# Patient Record
Sex: Male | Born: 1980 | Race: Black or African American | Hispanic: No | Smoking: Current every day smoker
Health system: Southern US, Community
[De-identification: ages and names within clinical notes are randomized; demographics above are authoritative.]

## PROBLEM LIST (undated history)

## (undated) DIAGNOSIS — F419 Anxiety disorder, unspecified: Secondary | ICD-10-CM

## (undated) DIAGNOSIS — F32A Depression, unspecified: Secondary | ICD-10-CM

## (undated) DIAGNOSIS — Z8619 Personal history of other infectious and parasitic diseases: Secondary | ICD-10-CM

## (undated) DIAGNOSIS — G4733 Obstructive sleep apnea (adult) (pediatric): Secondary | ICD-10-CM

## (undated) DIAGNOSIS — K219 Gastro-esophageal reflux disease without esophagitis: Secondary | ICD-10-CM

## (undated) DIAGNOSIS — F329 Major depressive disorder, single episode, unspecified: Secondary | ICD-10-CM

## (undated) HISTORY — DX: Personal history of other infectious and parasitic diseases: Z86.19

## (undated) HISTORY — DX: Anxiety disorder, unspecified: F41.9

## (undated) HISTORY — DX: Gastro-esophageal reflux disease without esophagitis: K21.9

## (undated) HISTORY — DX: Major depressive disorder, single episode, unspecified: F32.9

## (undated) HISTORY — DX: Obstructive sleep apnea (adult) (pediatric): G47.33

## (undated) HISTORY — DX: Depression, unspecified: F32.A

## (undated) HISTORY — PX: VEIN SURGERY: SHX48

---
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
## 1999-10-20 ENCOUNTER — Emergency Department (HOSPITAL_COMMUNITY): Admission: EM | Admit: 1999-10-20 | Discharge: 1999-10-20 | Payer: Self-pay

## 1999-10-23 ENCOUNTER — Ambulatory Visit (HOSPITAL_COMMUNITY): Admission: RE | Admit: 1999-10-23 | Discharge: 1999-10-23 | Payer: Self-pay

## 1999-10-25 ENCOUNTER — Encounter: Payer: Self-pay | Admitting: Emergency Medicine

## 1999-10-25 ENCOUNTER — Emergency Department (HOSPITAL_COMMUNITY): Admission: EM | Admit: 1999-10-25 | Discharge: 1999-10-25 | Payer: Self-pay | Admitting: Emergency Medicine

## 2002-01-08 ENCOUNTER — Emergency Department (HOSPITAL_COMMUNITY): Admission: EM | Admit: 2002-01-08 | Discharge: 2002-01-08 | Payer: Self-pay | Admitting: Emergency Medicine

## 2004-11-03 ENCOUNTER — Emergency Department (HOSPITAL_COMMUNITY): Admission: EM | Admit: 2004-11-03 | Discharge: 2004-11-03 | Payer: Self-pay | Admitting: Emergency Medicine

## 2006-07-24 ENCOUNTER — Emergency Department (HOSPITAL_COMMUNITY): Admission: EM | Admit: 2006-07-24 | Discharge: 2006-07-24 | Payer: Self-pay | Admitting: Family Medicine

## 2009-03-04 ENCOUNTER — Emergency Department (HOSPITAL_COMMUNITY): Admission: EM | Admit: 2009-03-04 | Discharge: 2009-03-04 | Payer: Self-pay | Admitting: Emergency Medicine

## 2010-03-05 ENCOUNTER — Ambulatory Visit: Payer: Self-pay | Admitting: Family Medicine

## 2010-03-05 DIAGNOSIS — Z9189 Other specified personal risk factors, not elsewhere classified: Secondary | ICD-10-CM | POA: Insufficient documentation

## 2010-03-05 DIAGNOSIS — K2990 Gastroduodenitis, unspecified, without bleeding: Secondary | ICD-10-CM

## 2010-03-05 DIAGNOSIS — R197 Diarrhea, unspecified: Secondary | ICD-10-CM | POA: Insufficient documentation

## 2010-03-05 DIAGNOSIS — K2970 Gastritis, unspecified, without bleeding: Secondary | ICD-10-CM | POA: Insufficient documentation

## 2010-03-05 DIAGNOSIS — K219 Gastro-esophageal reflux disease without esophagitis: Secondary | ICD-10-CM | POA: Insufficient documentation

## 2010-03-05 DIAGNOSIS — F17200 Nicotine dependence, unspecified, uncomplicated: Secondary | ICD-10-CM | POA: Insufficient documentation

## 2010-03-27 ENCOUNTER — Ambulatory Visit: Payer: Self-pay | Admitting: Family Medicine

## 2010-03-28 LAB — CONVERTED CEMR LAB
ALT: 27 units/L (ref 0–53)
AST: 21 units/L (ref 0–37)
Albumin: 3.9 g/dL (ref 3.5–5.2)
Alkaline Phosphatase: 82 units/L (ref 39–117)
BUN: 17 mg/dL (ref 6–23)
Basophils Absolute: 0 10*3/uL (ref 0.0–0.1)
Basophils Relative: 0.3 % (ref 0.0–3.0)
Bilirubin, Direct: 0.2 mg/dL (ref 0.0–0.3)
CO2: 24 meq/L (ref 19–32)
Calcium: 9 mg/dL (ref 8.4–10.5)
Chloride: 106 meq/L (ref 96–112)
Cholesterol: 165 mg/dL (ref 0–200)
Creatinine, Ser: 0.9 mg/dL (ref 0.4–1.5)
Eosinophils Absolute: 0.2 10*3/uL (ref 0.0–0.7)
Eosinophils Relative: 2.6 % (ref 0.0–5.0)
GFR calc non Af Amer: 123.03 mL/min (ref >60)
Glucose, Bld: 88 mg/dL (ref 70–99)
HCT: 50.1 % (ref 39.0–52.0)
HDL: 44.4 mg/dL (ref >39.00)
Hemoglobin: 17.1 g/dL — ABNORMAL HIGH (ref 13.0–17.0)
LDL Cholesterol: 102 mg/dL — ABNORMAL HIGH (ref 0–99)
Lymphocytes Relative: 26.9 % (ref 12.0–46.0)
Lymphs Abs: 2.2 10*3/uL (ref 0.7–4.0)
MCHC: 34.2 g/dL (ref 30.0–36.0)
MCV: 86.8 fL (ref 78.0–100.0)
Monocytes Absolute: 0.6 10*3/uL (ref 0.1–1.0)
Monocytes Relative: 7.9 % (ref 3.0–12.0)
Neutro Abs: 5 10*3/uL (ref 1.4–7.7)
Neutrophils Relative %: 62.3 % (ref 43.0–77.0)
Platelets: 271 10*3/uL (ref 150.0–400.0)
Potassium: 4.4 meq/L (ref 3.5–5.1)
RBC: 5.77 M/uL (ref 4.22–5.81)
RDW: 14.2 % (ref 11.5–14.6)
Sodium: 140 meq/L (ref 135–145)
TSH: 1.08 microintl units/mL (ref 0.35–5.50)
Total Bilirubin: 0.7 mg/dL (ref 0.3–1.2)
Total CHOL/HDL Ratio: 4
Total Protein: 6.7 g/dL (ref 6.0–8.3)
Triglycerides: 93 mg/dL (ref 0.0–149.0)
VLDL: 18.6 mg/dL (ref 0.0–40.0)
WBC: 8.1 10*3/uL (ref 4.5–10.5)

## 2010-04-19 ENCOUNTER — Ambulatory Visit: Payer: Self-pay | Admitting: Family Medicine

## 2010-04-19 DIAGNOSIS — I8390 Asymptomatic varicose veins of unspecified lower extremity: Secondary | ICD-10-CM

## 2010-04-19 DIAGNOSIS — I8391 Asymptomatic varicose veins of right lower extremity: Secondary | ICD-10-CM | POA: Insufficient documentation

## 2010-07-23 NOTE — Assessment & Plan Note (Signed)
Summary: CPX/CLE   Vital Signs:  Patient profile:   30 year old male Height:      72 inches Weight:      251.0 pounds BMI:     34.16 Temp:     98.8 degrees F oral Pulse rate:   72 / minute Pulse rhythm:   regular BP sitting:   110 / 70  (left arm) Cuff size:   regular  Vitals Entered By: Benny Lennert CMA Duncan Dull) (April 19, 2010 8:41 AM)  History of Present Illness: Chief complaint cpx  GERD, gastriris...improved with diet changes.  Prilosec helps as well.  Diarrhea..better with increasing fiber in diet. Better energy.  Has area on right inner lower leg.. varicose vein.Marland Kitchennoted years ago. It is getting bigger. When on treadmill right leg ach than left. No pain with sitting or standing.  Does have pins and needles in B toes occ.    Problems Prior to Update: 1)  Screening For Lipoid Disorders  (ICD-V77.91) 2)  Tobacco Abuse  (ICD-305.1) 3)  Gastritis  (ICD-535.50) 4)  Diarrhea  (ICD-787.91) 5)  Gerd  (ICD-530.81) 6)  Chickenpox, Hx of  (ICD-V15.9)  Current Medications (verified): 1)  Prilosec 20 Mg Cpdr (Omeprazole) .... 2 Tab By Mouth Daily  Allergies (verified): No Known Drug Allergies  Past History:  Past medical, surgical, family and social histories (including risk factors) reviewed, and no changes noted (except as noted below).  Past Medical History: Reviewed history from 03/05/2010 and no changes required. Current Problems:  CHICKENPOX, HX OF (ICD-V15.9)    Past Surgical History: Reviewed history from 03/05/2010 and no changes required. none  Family History: Reviewed history from 03/05/2010 and no changes required. father:healthy mother:healthy siblings: healthy, ?depression/schizophrenia in brother  no colon cancer or stmache cancer in family  Social History: Reviewed history from 03/05/2010 and no changes required. Occupation:Works for [employer] Single 1 daughter...healthy 8 years olf Recently got out of bad relationship with mother  of child. Current Smoker: 1 ppd Alcohol use-yes, 3 drinks every other weekend. Drug use-yes, marijuana occ Regular exercise-yes, 3 times a week.  Diet: skips reakfast, occ lunch, large dinner, a lot of fast food.  Review of Systems General:  Denies fatigue and fever. CV:  Denies chest pain or discomfort. Resp:  Denies shortness of breath. GI:  Denies abdominal pain, bloody stools, constipation, and diarrhea. GU:  Denies dysuria.  Physical Exam  General:  obese appearing male with central obesity in NAD Eyes:  No corneal or conjunctival inflammation noted. EOMI. Perrla. Funduscopic exam benign, without hemorrhages, exudates or papilledema. Vision grossly normal. Ears:  External ear exam shows no significant lesions or deformities.  Otoscopic examination reveals clear canals, tympanic membranes are intact bilaterally without bulging, retraction, inflammation or discharge. Hearing is grossly normal bilaterally. Nose:  External nasal examination shows no deformity or inflammation. Nasal mucosa are pink and moist without lesions or exudates. Mouth:  Oral mucosa and oropharynx without lesions or exudates.  Teeth in good repair. Neck:  no carotid bruit or thyromegaly no cervical or supraclavicular lymphadenopathy  Lungs:  Normal respiratory effort, chest expands symmetrically. Lungs are clear to auscultation, no crackles or wheezes. Heart:  Normal rate and regular rhythm. S1 and S2 normal without gallop, murmur, click, rub or other extra sounds. Abdomen:  Bowel sounds positive,abdomen soft and non-tender without masses, organomegaly or hernias noted. Genitalia:  Testes bilaterally descended without nodularity, tenderness or masses. No scrotal masses or lesions. No penis lesions or urethral discharge. Msk:  No  deformity or scoliosis noted of thoracic or lumbar spine.   Pulses:  R and L posterior tibial pulses are full and equal bilaterally  Extremities:  no edema Neurologic:  No cranial nerve  deficits noted. Station and gait are normal. Plantar reflexes are down-going bilaterally. DTRs are symmetrical throughout. Sensory, motor and coordinative functions appear intact. Skin:  Intact without suspicious lesions or rashes Psych:  Cognition and judgment appear intact. Alert and cooperative with normal attention span and concentration. No apparent delusions, illusions, hallucinations   Impression & Recommendations:  Problem # 1:  Preventive Health Care (ICD-V70.0) The patient's preventative maintenance and recommended screening tests for an annual wellness exam were reviewed in full today. Brought up to date unless services declined.  Counselled on the importance of diet, exercise, and its role in overall health and mortality. The patient's FH and SH was reviewed, including their home life, tobacco status, and drug and alcohol status.     Problem # 2:  VARICOSE VEINS, LOWER EXTREMITIES (ICD-454.9)  Orders: Vascular Clinic (Vascular)  Complete Medication List: 1)  Prilosec 20 Mg Cpdr (Omeprazole) .... 2 tab by mouth daily  Other Orders: Tdap => 11yrs IM (95284) Admin 1st Vaccine (13244)  Patient Instructions: 1)  Work on exercsie, weight loss, elevate feet above heart, look into compression hose 15-30 mmHG...online ocntinue excellent changes in diet. 2)   Consider smoking cessation with Chantix given at last OV. 3)  Please schedule a follow-up appointment in 1 year.  4)  Referral Appointment Information 5)  Day/Date: 6)  Time: 7)  Place/MD: 8)  Address: 9)  Phone/Fax: 10)  Patient given appointment information. Information/Orders faxed/mailed.    Orders Added: 1)  Tdap => 97yrs IM [90715] 2)  Admin 1st Vaccine [90471] 3)  Vascular Clinic [Vascular] 4)  Est. Patient 18-39 years [99395]   Immunizations Administered:  Tetanus Vaccine:    Vaccine Type: Tdap    Site: right deltoid    Mfr: GlaxoSmithKline    Dose: 0.5 ml    Route: IM    Given by: Benny Lennert  CMA (AAMA)    Exp. Date: 04/11/2012    Lot #: [id number]    VIS given: 05/10/08 version given April 19, 2010.   Immunizations Administered:  Tetanus Vaccine:    Vaccine Type: Tdap    Site: right deltoid    Mfr: GlaxoSmithKline    Dose: 0.5 ml    Route: IM    Given by: Benny Lennert CMA (AAMA)    Exp. Date: 04/11/2012    Lot #: [id number]    VIS given: 05/10/08 version given April 19, 2010.  Current Allergies (reviewed today): No known allergies   Flu Vaccine Next Due:  Refused TD Result Date:  04/19/2010 TD Result:  given TD Next Due:  10 yr

## 2010-07-23 NOTE — Assessment & Plan Note (Signed)
Summary: NEW PT TO ESTABH/DLO   Vital Signs:  Patient profile:   30 year old male Height:      72 inches Weight:      251.0 pounds BMI:     34.16 Temp:     98.7 degrees F oral Pulse rate:   72 / minute Pulse rhythm:   regular BP sitting:   120 / 70  (left arm) Cuff size:   regular  Vitals Entered By: Benny Lennert CMA Duncan Dull) (March 05, 2010 10:02 AM)  History of Present Illness: Chief complaint new patient to be established    Concerned he may have stomach ulcer. UNder a lot of stress.  Any type of food he eats causes diarrhea or GERD. Occ abdominal cramping. Hot feeling in throat.... occurs daily.  Using a lot of Tums or ROlaids.  He reports he has 3-4 "stomach viruses" a year.Marland KitchenMarland KitchenN/V/diarrhea  Preventive Screening-Counseling & Management  Alcohol-Tobacco     Smoking Status: current  Caffeine-Diet-Exercise     Does Patient Exercise: yes      Drug Use:  yes.    Allergies (verified): No Known Drug Allergies  Past History:  Past Medical History: Current Problems:  CHICKENPOX, HX OF (ICD-V15.9)    Past Surgical History: none  Family History: father:healthy mother:healthy siblings: healthy, ?depression/schizophrenia in brother  no colon cancer or stmache cancer in family  Social History: Occupation:Works for [employer] Single 1 daughter...healthy 8 years olf Recently got out of bad relationship with mother of child. Current Smoker: 1 ppd Alcohol use-yes, 3 drinks every other weekend. Drug use-yes, marijuana occ Regular exercise-yes, 3 times a week.  Diet: skips reakfast, occ lunch, large dinner, a lot of fast food. Occupation:  employed Smoking Status:  current Drug Use:  yes Does Patient Exercise:  yes  Review of Systems CV:  Denies chest pain or discomfort. Resp:  Complains of shortness of breath; denies cough, sputum productive, and wheezing. GI:  Complains of constipation, diarrhea, gas, indigestion, and nausea; denies bloody  stools. GU:  Denies dysuria. Derm:  Denies rash. Psych:  Denies anxiety, depression, panic attacks, and suicidal thoughts/plans.  Physical Exam  General:  obese appearing male with central obesity in NAD Ears:  External ear exam shows no significant lesions or deformities.  Otoscopic examination reveals clear canals, tympanic membranes are intact bilaterally without bulging, retraction, inflammation or discharge. Hearing is grossly normal bilaterally. Nose:  External nasal examination shows no deformity or inflammation. Nasal mucosa are pink and moist without lesions or exudates. Mouth:  Oral mucosa and oropharynx without lesions or exudates.  Teeth in good repair. Neck:  no carotid bruit or thyromegaly , no cervical or supraclavicular lymphadenopathy  Lungs:  Normal respiratory effort, chest expands symmetrically. Lungs are clear to auscultation, no crackles or wheezes. Heart:  Normal rate and regular rhythm. S1 and S2 normal without gallop, murmur, click, rub or other extra sounds. Abdomen:  mild ttp in epigastrum, no rebound, no guareding, no hepatomegaly and no splenomegaly.   Msk:  No deformity or scoliosis noted of thoracic or lumbar spine.   Pulses:  R and L posterior tibial pulses are full and equal bilaterally  Extremities:  no edema  Skin:  Intact without suspicious lesions or rashes Psych:  Cognition and judgment appear intact. Alert and cooperative with normal attention span and concentration. No apparent delusions, illusions, hallucinations   Impression & Recommendations:  Problem # 1:  GERD (ICD-530.81)  His updated medication list for this problem includes:  Prilosec 20 Mg Cpdr (Omeprazole) .Marland Kitchen... 2 tab by mouth daily   Discussed lifestyle modifications, diet, antacids/medications, and preventive measures. Handout provided.  Start prilosec 40 mg daily x 4-6 weeks.  Problem # 2:  GASTRITIS (ICD-535.50) If not improving with treatment of GERD...consider H pYlori eval  or endoscopy.  His updated medication list for this problem includes:    Prilosec 20 Mg Cpdr (Omeprazole) .Marland Kitchen... 2 tab by mouth daily  Problem # 3:  DIARRHEA (ICD-787.91) Eval with labs. Likel IBS. Discussed diet and lifestyle changes in detail.   Problem # 4:  TOBACCO ABUSE (ICD-305.1)  His updated medication list for this problem includes:    Chantix Starting Month Pak 0.5 Mg X 11 & 1 Mg X 42 Tabs (Varenicline tartrate) .Marland Kitchen... As directed    Chantix Continuing Month Pak 1 Mg Tabs (Varenicline tartrate) .Marland Kitchen... 1 tab by mouth two times a day  Orders: Tobacco use cessation intermediate 3-10 minutes (04540)  Encouraged smoking cessation and discussed different methods for smoking cessation.   Complete Medication List: 1)  Chantix Starting Month Pak 0.5 Mg X 11 & 1 Mg X 42 Tabs (Varenicline tartrate) .... As directed 2)  Chantix Continuing Month Pak 1 Mg Tabs (Varenicline tartrate) .Marland Kitchen.. 1 tab by mouth two times a day 3)  Prilosec 20 Mg Cpdr (Omeprazole) .... 2 tab by mouth daily  Patient Instructions: 1)  Schedule CPX in next 1-2 months. 2)  Schedule fasting lipids, CMET, TSH, Cbc Dx v77.91, 787.91 3)  Stop soda ..instead drink water, crystal light 4)  Decrease caffeine, citris, tomato, peppermint. 5)  Largest meal earlier in day. 6)  Prilosec (omeprazole) 2 tabs (20 mg) by mouth daily ... take for 4- 6weeks then wean off.  Call if no improvement in reflux after 1-2 weeks. 7)  Stop smoking. Start Chantix. 8)  Decrease greasy food. 9)  Increase fiber in diet..fruits and veggies. Whole wheat bread. 10)  Consider trying [employer]. Prescriptions: CHANTIX CONTINUING MONTH PAK 1 MG TABS (VARENICLINE TARTRATE) 1 tab by mouth two times a day  #1 pack x 5   Entered and Authorized by:   Kerby Nora MD   Signed by:   Kerby Nora MD on 03/05/2010   Method used:   Electronically to        [facility] # [id number]* (retail)       [address] [place], [place]  [address]        Ph: [phone]       Fax: [phone]   RxID:   [id number] CHANTIX STARTING MONTH PAK 0.5 MG X 11 & 1 MG X 42 TABS (VARENICLINE TARTRATE) as directed  #1  pack x 0   Entered and Authorized by:   Kerby Nora MD   Signed by:   Kerby Nora MD on 03/05/2010   Method used:   Electronically to        [facility] # [phone]* (retail)       [address] [place], [place]  [address]       Ph: [phone]       Fax: [phone]   RxID:   [id number]   Prior Medications (reviewed today): None Current Allergies (reviewed today): No known allergies

## 2011-01-09 ENCOUNTER — Encounter: Payer: Self-pay | Admitting: Family Medicine

## 2011-01-14 ENCOUNTER — Ambulatory Visit (INDEPENDENT_AMBULATORY_CARE_PROVIDER_SITE_OTHER): Payer: Managed Care, Other (non HMO) | Admitting: Family Medicine

## 2011-01-14 ENCOUNTER — Encounter: Payer: Self-pay | Admitting: Family Medicine

## 2011-01-14 VITALS — BP 120/88 | HR 73 | Temp 98.4°F | Ht 72.0 in | Wt 269.5 lb

## 2011-01-14 DIAGNOSIS — F32A Depression, unspecified: Secondary | ICD-10-CM | POA: Insufficient documentation

## 2011-01-14 DIAGNOSIS — F419 Anxiety disorder, unspecified: Secondary | ICD-10-CM

## 2011-01-14 DIAGNOSIS — F410 Panic disorder [episodic paroxysmal anxiety] without agoraphobia: Secondary | ICD-10-CM

## 2011-01-14 DIAGNOSIS — F411 Generalized anxiety disorder: Secondary | ICD-10-CM

## 2011-01-14 MED ORDER — BUPROPION HCL ER (XL) 150 MG PO TB24: 150.0000 mg | ORAL_TABLET | Freq: Every day | ORAL | Status: DC

## 2011-01-14 NOTE — Patient Instructions (Signed)
Start bupropion daily in morning. Call if interested in counselor.  Work on daily exercise as able.

## 2011-01-14 NOTE — Assessment & Plan Note (Signed)
Start wellbutrin daily given SE profile. Reviewed med use and SE profile with pt in detail.  Recommended exercise and counseling. Pt will consider pshycology referral.  Follow up in 1 month.  Spent >30 min counseling pt , answering questions and pt care coordination.
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
## 2011-01-26 ENCOUNTER — Emergency Department (HOSPITAL_COMMUNITY)
Admission: EM | Admit: 2011-01-26 | Discharge: 2011-01-26 | Disposition: A | Discharge status: Home / Self Care | Payer: Managed Care, Other (non HMO) | Attending: Emergency Medicine | Admitting: Emergency Medicine

## 2011-01-26 DIAGNOSIS — K089 Disorder of teeth and supporting structures, unspecified: Secondary | ICD-10-CM | POA: Insufficient documentation

## 2011-01-26 DIAGNOSIS — K029 Dental caries, unspecified: Secondary | ICD-10-CM | POA: Insufficient documentation

## 2011-02-21 ENCOUNTER — Ambulatory Visit: Payer: Managed Care, Other (non HMO) | Admitting: Family Medicine

## 2011-02-27 ENCOUNTER — Ambulatory Visit (INDEPENDENT_AMBULATORY_CARE_PROVIDER_SITE_OTHER): Payer: Managed Care, Other (non HMO) | Admitting: Family Medicine

## 2011-02-27 ENCOUNTER — Encounter: Payer: Self-pay | Admitting: Family Medicine

## 2011-02-27 DIAGNOSIS — F329 Major depressive disorder, single episode, unspecified: Secondary | ICD-10-CM

## 2011-02-27 DIAGNOSIS — F419 Anxiety disorder, unspecified: Secondary | ICD-10-CM

## 2011-02-27 DIAGNOSIS — F410 Panic disorder [episodic paroxysmal anxiety] without agoraphobia: Secondary | ICD-10-CM

## 2011-02-27 DIAGNOSIS — F341 Dysthymic disorder: Secondary | ICD-10-CM

## 2011-02-27 DIAGNOSIS — F32A Depression, unspecified: Secondary | ICD-10-CM

## 2011-02-27 MED ORDER — SERTRALINE HCL 50 MG PO TABS: 50.0000 mg | ORAL_TABLET | Freq: Every day | ORAL | Status: DC

## 2011-02-27 NOTE — Patient Instructions (Signed)
Start sertraline 50 mg daily. Stop by front desk to speak with Shirlee Limerick about info packet for counsling referral.

## 2011-02-27 NOTE — Progress Notes (Signed)
  Subjective:    Patient ID: Alex Parsons, male    DOB: 06-22-1981, 30 y.o.   MRN: [id number]  HPI  30 year male presents for anxiety follow up... He reports he tried wellbutrin without any improvement. He reports his symptoms are the same as previously noted in last OV.  Continued issues with panic attacks, driving associated often. Also generalized anxiety through the day.       Review of Systems  Respiratory: Negative for shortness of breath.   Cardiovascular: Negative for chest pain.  Gastrointestinal: Negative for abdominal pain.  Psychiatric/Behavioral: Negative for suicidal ideas, hallucinations and self-injury.       Objective:   Physical Exam  Constitutional: He appears well-developed and well-nourished.  Cardiovascular: Normal rate, regular rhythm, normal heart sounds and intact distal pulses.   Pulmonary/Chest: Effort normal.  Skin: Skin is warm and dry.  Psychiatric: He has a normal mood and affect. His behavior is normal. Judgment and thought content normal.          Assessment & Plan:
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
## 2011-03-12 ENCOUNTER — Ambulatory Visit: Payer: Managed Care, Other (non HMO) | Admitting: Psychology

## 2011-04-07 ENCOUNTER — Telehealth: Payer: Self-pay | Admitting: Family Medicine

## 2011-04-07 DIAGNOSIS — Z13220 Encounter for screening for lipoid disorders: Secondary | ICD-10-CM

## 2011-04-07 NOTE — Telephone Encounter (Signed)
Error

## 2011-04-07 NOTE — Telephone Encounter (Signed)
Let me know if pt interested in STD testing.

## 2011-04-07 NOTE — Telephone Encounter (Signed)
Message copied by Excell Seltzer on Mon Apr 07, 2011  4:30 PM ------      Message from: Alvina Chou      Created: Mon Apr 07, 2011  3:31 PM      Regarding: Labs for Friday 10-19       Patient is scheduled for CPX labs, please order future labs, Thanks , Camelia Eng

## 2011-04-11 ENCOUNTER — Other Ambulatory Visit: Payer: Managed Care, Other (non HMO)

## 2011-04-18 ENCOUNTER — Ambulatory Visit (INDEPENDENT_AMBULATORY_CARE_PROVIDER_SITE_OTHER): Payer: Managed Care, Other (non HMO) | Admitting: Family Medicine

## 2011-04-18 ENCOUNTER — Encounter: Payer: Self-pay | Admitting: Family Medicine

## 2011-04-18 VITALS — BP 110/72 | HR 78 | Temp 98.1°F | Ht 72.0 in | Wt 263.0 lb

## 2011-04-18 DIAGNOSIS — F32A Depression, unspecified: Secondary | ICD-10-CM

## 2011-04-18 DIAGNOSIS — Z0000 Encounter for general adult medical examination without abnormal findings: Secondary | ICD-10-CM

## 2011-04-18 DIAGNOSIS — F17200 Nicotine dependence, unspecified, uncomplicated: Secondary | ICD-10-CM

## 2011-04-18 DIAGNOSIS — Z13220 Encounter for screening for lipoid disorders: Secondary | ICD-10-CM

## 2011-04-18 DIAGNOSIS — F410 Panic disorder [episodic paroxysmal anxiety] without agoraphobia: Secondary | ICD-10-CM

## 2011-04-18 DIAGNOSIS — F419 Anxiety disorder, unspecified: Secondary | ICD-10-CM

## 2011-04-18 DIAGNOSIS — F341 Dysthymic disorder: Secondary | ICD-10-CM

## 2011-04-18 MED ORDER — SERTRALINE HCL 50 MG PO TABS: 50.0000 mg | ORAL_TABLET | Freq: Every day | ORAL | Status: DC

## 2011-04-18 NOTE — Patient Instructions (Addendum)
Strongly consider trial of sertraline and counseling as discussed previously.  Work on exercise, healthy eating and weight loss. Return for fasting labs in next few weeks. Follow up in next 1-3 months if continued issues with mood.

## 2011-04-18 NOTE — Progress Notes (Signed)
Subjective:    Patient ID: Alex Parsons, male    DOB: November 26, 1980, 30 y.o.   MRN: [id number]  HPIThe patient is here for annual wellness exam and preventative care.     Anxiety, depression, panic attacks: Last OV recommended trial of sertraline.Marland Kitchen Pt  Reports he just felt like not taking a medication... Never started. He is open to starting now. He did not tolerate wellbutrin as well. Less panic attacks with driving.. Avoiding being a passanger driver, avoids rush hour. Still some issues with depression and anxiety. No SI/HI. Sleeping moderate control.. Having some back pain.  Counseling: Could not make appt with counselor.      Review of Systems  Constitutional: Negative for fever, fatigue and unexpected weight change.  HENT: Negative for ear pain, congestion, sore throat, rhinorrhea, trouble swallowing and postnasal drip.   Eyes: Negative for pain.  Respiratory: Negative for cough, shortness of breath and wheezing.   Cardiovascular: Negative for chest pain, palpitations and leg swelling.  Gastrointestinal: Negative for nausea, abdominal pain, diarrhea, constipation and blood in stool.  Genitourinary: Negative for dysuria, urgency, hematuria, discharge, penile swelling, scrotal swelling, difficulty urinating, penile pain and testicular pain.  Skin: Negative for rash.  Neurological: Negative for syncope, weakness, light-headedness, numbness and headaches.  Psychiatric/Behavioral: Positive for sleep disturbance and dysphoric mood. Negative for suicidal ideas. The patient is nervous/anxious.        Objective:   Physical Exam  Constitutional: He appears well-developed and well-nourished.  Non-toxic appearance. He does not appear ill. No distress.       OBESE  HENT:  Head: Normocephalic and atraumatic.  Right Ear: Hearing, tympanic membrane, external ear and ear canal normal.  Left Ear: Hearing, tympanic membrane, external ear and ear canal normal.  Nose: Nose normal.  Mouth/Throat:  Uvula is midline, oropharynx is clear and moist and mucous membranes are normal.  Eyes: Conjunctivae, EOM and lids are normal. Pupils are equal, round, and reactive to light. No foreign bodies found.  Neck: Trachea normal, normal range of motion and phonation normal. Neck supple. Carotid bruit is not present. No mass and no thyromegaly present.  Cardiovascular: Normal rate, regular rhythm, S1 normal, S2 normal, intact distal pulses and normal pulses.  Exam reveals no gallop.   No murmur heard. Pulmonary/Chest: Breath sounds normal. He has no wheezes. He has no rhonchi. He has no rales.  Abdominal: Soft. Normal appearance and bowel sounds are normal. There is no hepatosplenomegaly. There is no tenderness. There is no rebound, no guarding and no CVA tenderness. No hernia. Hernia confirmed negative in the right inguinal area and confirmed negative in the left inguinal area.  Genitourinary: Testes normal and penis normal. Right testis shows no mass and no tenderness. Left testis shows no mass and no tenderness. No paraphimosis or penile tenderness.  Lymphadenopathy:    He has no cervical adenopathy.       Right: No inguinal adenopathy present.       Left: No inguinal adenopathy present.  Neurological: He is alert. He has normal strength and normal reflexes. No cranial nerve deficit or sensory deficit. Gait normal.  Skin: Skin is warm, dry and intact. No rash noted.  Psychiatric: He has a normal mood and affect. His speech is normal and behavior is normal. Judgment normal.          Assessment & Plan:  The patient's preventative maintenance and recommended screening tests for an annual wellness exam were reviewed in full today. Brought up to date unless  services declined.  Counselled on the importance of diet, exercise, and its role in overall health and mortality. The patient's FH and SH was reviewed, including their home life, tobacco status, and drug and alcohol status.   Flu: refused. Up  to date with Tdap

## 2011-04-18 NOTE — Assessment & Plan Note (Signed)
Improved

## 2011-04-18 NOTE — Assessment & Plan Note (Signed)
Precontemplative

## 2011-04-18 NOTE — Assessment & Plan Note (Signed)
Inadequate control... Recommend SSRI and counseling.. Pt will reconsider. Follow up in next 1-3  months.

## 2011-04-29 ENCOUNTER — Other Ambulatory Visit: Payer: Managed Care, Other (non HMO)

## 2011-05-06 ENCOUNTER — Other Ambulatory Visit (INDEPENDENT_AMBULATORY_CARE_PROVIDER_SITE_OTHER): Payer: Managed Care, Other (non HMO)

## 2011-05-06 DIAGNOSIS — Z13220 Encounter for screening for lipoid disorders: Secondary | ICD-10-CM

## 2011-05-06 LAB — COMPREHENSIVE METABOLIC PANEL
Alkaline Phosphatase: 68 U/L (ref 39–117)
BUN: 16 mg/dL (ref 6–23)
GFR: 122.17 mL/min (ref >60.00)
Glucose, Bld: 88 mg/dL (ref 70–99)
Total Bilirubin: 0.8 mg/dL (ref 0.3–1.2)

## 2011-05-06 LAB — LIPID PANEL
Cholesterol: 184 mg/dL (ref 0–200)
HDL: 51 mg/dL (ref >39.00)
Triglycerides: 114 mg/dL (ref 0.0–149.0)
VLDL: 22.8 mg/dL (ref 0.0–40.0)

## 2011-05-07 ENCOUNTER — Encounter: Payer: Self-pay | Admitting: *Deleted

## 2015-04-30 ENCOUNTER — Institutional Professional Consult (permissible substitution): Payer: BLUE CROSS/BLUE SHIELD | Admitting: Neurology

## 2015-05-15 ENCOUNTER — Encounter: Payer: Self-pay | Admitting: Neurology

## 2015-05-15 ENCOUNTER — Ambulatory Visit (INDEPENDENT_AMBULATORY_CARE_PROVIDER_SITE_OTHER): Payer: BLUE CROSS/BLUE SHIELD | Admitting: Neurology

## 2015-05-15 ENCOUNTER — Encounter (INDEPENDENT_AMBULATORY_CARE_PROVIDER_SITE_OTHER): Payer: Self-pay

## 2015-05-15 VITALS — BP 120/72 | HR 70 | Resp 16 | Ht 72.0 in | Wt 274.0 lb

## 2015-05-15 DIAGNOSIS — E669 Obesity, unspecified: Secondary | ICD-10-CM | POA: Diagnosis not present

## 2015-05-15 DIAGNOSIS — R0683 Snoring: Secondary | ICD-10-CM | POA: Diagnosis not present

## 2015-05-15 DIAGNOSIS — R0681 Apnea, not elsewhere classified: Secondary | ICD-10-CM | POA: Diagnosis not present

## 2015-05-15 DIAGNOSIS — R351 Nocturia: Secondary | ICD-10-CM

## 2015-05-15 NOTE — Progress Notes (Signed)
Subjective:    Patient ID: Alex Parsons is a 34 y.o. male.  HPI     Alex Foley, MD, PhD [facility] [address], [address] [address] [place], [place] [address]  Dear Dr. Helmut Parsons,    I saw your patient, Alex Parsons, upon your kind request, in my neurologic clinic today for initial consultation of his sleep disorder, in particular, concern for underlying obstructive sleep apnea. The patient is unaccompanied today. As you know, Alex Parsons is a 34 year old right-handed gentleman with an underlying medical history of anxiety, depression, smoking obesity, who reports snoring, witnessed breathing pauses while asleep and some daytime tiredness. He has nocturia, usually once per night. He denies morning headaches, restless leg syndrome type symptoms and a family history of OSA. He goes to bed around 11:30 and his rise time is around 6. He feels adequately rested when he first wakes up. He drinks caffeine in the form of coffee, 2-1/2 cups per day and one can of soda per day. He smokes about half a pack per day and drinks alcohol occasionally. He does not currently take any prescription medications. excessive daytime somnolence. He lives with his fiance and 33 year old daughter. His fiance has noticed pauses in his breathing and abnormal sounds including gurgling sounds. He has woken himself up with his snoring. He works full-time for [employer] [place]. He has a sedentary job.  His Past Medical History Is Significant For: Past Medical History  Diagnosis Date  . History of chicken pox     Her Past Surgical History Is Significant For: Past Surgical History  Procedure Laterality Date  . Vein surgery      R Leg    Her Family History Is Significant For: Family History  Problem Relation Age of Onset  . Depression Brother   . Mental retardation Brother     His Social History Is Significant For: Social History   Social History  . Marital Status: Single    Spouse Name: N/A   . Number of Children: 1  . Years of Education: N/A   Occupational History  .  [employer] [place]   Social History Main Topics  . Smoking status: Current Every Day Smoker  . Smokeless tobacco: None  . Alcohol Use: 0.0 oz/week    0 Standard drinks or equivalent per week  . Drug Use: No  . Sexual Activity: Not Asked   Other Topics Concern  . None   Social History Narrative   Regular exercise-- yes 3 times a week      Diet: skips breakfast, occ lunch, large dinner, a lot of fast food      Drinks coffee daily     His Allergies Are:  No Known Allergies:   His Current Medications Are:  Outpatient Encounter Prescriptions as of 05/15/2015  Medication Sig  . [DISCONTINUED] sertraline (ZOLOFT) 50 MG tablet Take 1 tablet (50 mg total) by mouth daily.   No facility-administered encounter medications on file as of 05/15/2015.  :  Review of Systems:  Out of a complete 14 point review of systems, all are reviewed and negative with the exception of these symptoms as listed below:   Review of Systems  Neurological:       No trouble falling asleep, has some trouble staying asleep, snoring, witnessed apnea, feels tired when wakes up in the morning.   Epworth Sleepiness Scale 0= would never doze 1= slight chance of dozing 2= moderate chance of dozing 3= high chance of  dozing  Sitting and reading:1 Watching TV:1 Sitting inactive in a public place (ex. Theater or meeting):0 As a passenger in a car for an hour without a break:0 Lying down to rest in the afternoon:1 Sitting and talking to someone:0 Sitting quietly after lunch (no alcohol):0 In a car, while stopped in traffic:0 Total:3  Objective:  Neurologic Exam  Physical Exam Physical Examination:   Filed Vitals:   05/15/15 1111  BP: 120/72  Pulse: 70  Resp: 16    General Examination: The patient is a very pleasant 34 y.o. male in no acute distress. He appears well-developed and well-nourished and well groomed. He is  obese.  HEENT: Normocephalic, atraumatic, pupils are equal, round and reactive to light and accommodation. Funduscopic exam is normal with sharp disc margins noted. Extraocular tracking is good without limitation to gaze excursion or nystagmus noted. Normal smooth pursuit is noted. Hearing is grossly intact. Face is symmetric with normal facial animation and normal facial sensation. Speech is clear with no dysarthria noted. There is no hypophonia. There is no lip, neck/head, jaw or voice tremor. Neck is supple with full range of passive and active motion. There are no carotid bruits on auscultation. Oropharynx exam reveals: mild mouth dryness, adequate dental hygiene and moderate airway crowding, due to narrow airway entry, redundant soft palate, large or uvula and tonsils in place. Tonsils are about 1+ bilaterally. Mallampati is class II. Tongue protrudes centrally and palate elevates symmetrically. Neck size is 16.5 inches. He has a Moderate overbite. Nasal inspection reveals no significant nasal mucosal bogginess or redness and no septal deviation.   Chest: Clear to auscultation without wheezing, rhonchi or crackles noted.  Heart: S1+S2+0, regular and normal without murmurs, rubs or gallops noted.   Abdomen: Soft, non-tender and non-distended with normal bowel sounds appreciated on auscultation.  Extremities: There is no pitting edema in the distal lower extremities bilaterally. Pedal pulses are intact.  Skin: Warm and dry without trophic changes noted. There are no varicose veins.  Musculoskeletal: exam reveals no obvious joint deformities, tenderness or joint swelling or erythema.   Neurologically:  Mental status: The patient is awake, alert and oriented in all 4 spheres. His immediate and remote memory, attention, language skills and fund of knowledge are appropriate. There is no evidence of aphasia, agnosia, apraxia or anomia. Speech is clear with normal prosody and enunciation. Thought  process is linear. Mood is normal and affect is normal.  Cranial nerves II - XII are as described above under HEENT exam. In addition: shoulder shrug is normal with equal shoulder height noted. Motor exam: Normal bulk, strength and tone is noted. There is no drift, tremor or rebound. Romberg is negative. Reflexes are 2+ throughout. Babinski: Toes are flexor bilaterally. Fine motor skills and coordination: intact with normal finger taps, normal hand movements, normal rapid alternating patting, normal foot taps and normal foot agility.  Cerebellar testing: No dysmetria or intention tremor on finger to nose testing. Heel to shin is unremarkable bilaterally. There is no truncal or gait ataxia.  Sensory exam: intact to light touch, pinprick, vibration, temperature sense in the upper and lower extremities.  Gait, station and balance: He stands easily. No veering to one side is noted. No leaning to one side is noted. Posture is age-appropriate and stance is narrow based. Gait shows normal stride length and normal pace. No problems turning are noted. He turns en bloc. Tandem walk is unremarkable.   Assessment and Plan:   In summary, Alex Parsons is  a very pleasant 34 y.o.-year old male with a underlying medical history of obesity and smoking, who has a history and physical exam are keeping with obstructive sleep apnea (OSA). I had a long chat with the patient about my findings and the diagnosis of OSA, its prognosis and treatment options. We talked about medical treatments, surgical interventions and non-pharmacological approaches. I explained in particular the risks and ramifications of untreated moderate to severe OSA, especially with respect to developing cardiovascular disease down the Road, including congestive heart failure, difficult to treat hypertension, cardiac arrhythmias, or stroke. Even type 2 diabetes has, in part, been linked to untreated OSA. Symptoms of untreated OSA include daytime sleepiness,  memory problems, mood irritability and mood disorder such as depression and anxiety, lack of energy, as well as recurrent headaches, especially morning headaches. We talked about smoking cessation and trying to maintain a healthy lifestyle in general, as well as the importance of weight control. I encouraged the patient to eat healthy, exercise daily and keep well hydrated, to keep a scheduled bedtime and wake time routine, to not skip any meals and eat healthy snacks in between meals. I advised the patient not to drive when feeling sleepy. I recommended the following at this time: sleep study with potential positive airway pressure titration. (We will score hypopneas at 3% and split the sleep study into diagnostic and treatment portion, if the estimated. 2 hour AHI is >15/h).   I explained the sleep test procedure to the patient and also outlined possible surgical and non-surgical treatment options of OSA, including the use of a custom-made dental device (which would require a referral to a specialist dentist or oral surgeon), upper airway surgical options, such as pillar implants, radiofrequency surgery, tongue base surgery, and UPPP (which would involve a referral to an ENT surgeon). Rarely, jaw surgery such as mandibular advancement may be considered.  I also explained the CPAP treatment option to the patient, who indicated that he would be willing to try CPAP if the need arises. I explained the importance of being compliant with PAP treatment, not only for insurance purposes but primarily to improve His symptoms, and for the patient's long term health benefit, including to reduce His cardiovascular risks. I answered all his questions today and the patient was in agreement. I would like to see him back after the sleep study is completed and encouraged him to call with any interim questions, concerns, problems or updates.   Thank you very much for allowing me to participate in the care of this nice  patient. If I can be of any further assistance to you please do not hesitate to call me at [phone].  Sincerely,   Alex FoleySaima Chazlyn Cude, MD, PhD

## 2015-05-15 NOTE — Patient Instructions (Signed)

## 2015-06-15 ENCOUNTER — Ambulatory Visit (INDEPENDENT_AMBULATORY_CARE_PROVIDER_SITE_OTHER): Payer: BLUE CROSS/BLUE SHIELD | Admitting: Neurology

## 2015-06-15 DIAGNOSIS — G4733 Obstructive sleep apnea (adult) (pediatric): Secondary | ICD-10-CM

## 2015-06-15 DIAGNOSIS — G4720 Circadian rhythm sleep disorder, unspecified type: Secondary | ICD-10-CM

## 2015-06-15 DIAGNOSIS — G479 Sleep disorder, unspecified: Secondary | ICD-10-CM

## 2015-06-16 NOTE — Sleep Study (Signed)
Please see the scanned sleep study interpretation located in the procedure tab within the chart review section.   

## 2015-06-19 ENCOUNTER — Telehealth: Payer: Self-pay | Admitting: Neurology

## 2015-06-19 NOTE — Telephone Encounter (Signed)
Patient referred by Dr. Helmut MusterFlynn (his dentist), seen by me on 05/15/15, diagnostic PSG on 06/15/15, ins: [employer].   Please call and notify the patient that the recent sleep study did confirm the diagnosis of moderate to severe obstructive sleep apnea and that I would like to go over the details of the study during a follow up appointment, as he may want to discuss with Dr. Helmut MusterFlynn the use of an oral appliance first, since he was referred from the dentist's office. Pls arrange a followup appointment. Also, route or fax report to PCP and referring MD, if other than PCP.  Once you have spoken to patient, you can close this encounter.   Thanks,  Huston FoleySaima Adeli Frost, MD, PhD [facility]

## 2015-06-20 NOTE — Telephone Encounter (Signed)
I left a message for the patient letting him know that Dr. Frances FurbishAthar would like for him to come in for f/u appt to discuss sleep study and options. I left call back number for him to make appt. Copy will be sent to referring doctor and PCP.

## 2015-07-03 ENCOUNTER — Encounter: Payer: Self-pay | Admitting: Neurology

## 2015-07-03 ENCOUNTER — Ambulatory Visit (INDEPENDENT_AMBULATORY_CARE_PROVIDER_SITE_OTHER): Payer: BLUE CROSS/BLUE SHIELD | Admitting: Neurology

## 2015-07-03 VITALS — BP 122/70 | HR 80 | Resp 18 | Ht 72.0 in | Wt 275.0 lb

## 2015-07-03 DIAGNOSIS — G4733 Obstructive sleep apnea (adult) (pediatric): Secondary | ICD-10-CM | POA: Diagnosis not present

## 2015-07-03 NOTE — Patient Instructions (Signed)
As discussed, please make an appointment with Dr. Helmut MusterFlynn for discussion of your sleep apnea and potential use of an oral appliance. Alternatively, we could use an autoPAP machine at home for treatment of you sleep apnea. Let me know, what you would like to do. We could also bring you back for a full night CPAP titration study, which would be the gold standard.

## 2015-07-03 NOTE — Progress Notes (Signed)
Subjective:    Patient ID: Alex Parsons is a 35 y.o. male.  HPI     Interim history:   Alex Parsons is a 35 year old right-handed gentleman with an underlying medical history of anxiety, depression, smoking and obesity, who presents for follow-up consultation of his obstructive sleep apnea, after his recent baseline sleep study. The patient is unaccompanied today. I first met him on 05/15/2015 at the request of his dentist, at which time the patient reported snoring, witnessed apneic pauses while asleep and daytime tiredness. He also had occasional morning headaches and some nocturia. I invited him back for sleep study. He had a baseline sleep study on 06/15/2015 and I went over his test results with him in detail today. Sleep efficiency was 89.8% with a normal sleep latency of 3.5 minutes and wake after sleep onset of 33.5 minutes with moderate sleep fragmentation noted. His arousal index was mildly elevated at 13.9 per hour. He had an increased percentage of light stage sleep, a normal percentage of deep sleep and a markedly decreased percentage of REM sleep at 3.1%.    Today, 07/03/2015: He reports no new symptoms. He has had no medication changes and no new medical issues. He has not made an appointment for follow-up with his dentist as yet.   Previously:  05/15/2015: He reports snoring, witnessed breathing pauses while asleep and some daytime tiredness. He has nocturia, usually once per night. He denies morning headaches, restless leg syndrome type symptoms and a family history of OSA. He goes to bed around 11:30 and his rise time is around 6. He feels adequately rested when he first wakes up. He drinks caffeine in the form of coffee, 2-1/2 cups per day and one can of soda per day. He smokes about half a pack per day and drinks alcohol occasionally. He does not currently take any prescription medications. excessive daytime somnolence. He lives with his fiance and 72 year old daughter. His fiance  has noticed pauses in his breathing and abnormal sounds including gurgling sounds. He has woken himself up with his snoring. He works full-time for [employer] [place]. He has a sedentary job.  His Past Medical History Is Significant For: Past Medical History  Diagnosis Date  . History of chicken pox     His Past Surgical History Is Significant For: Past Surgical History  Procedure Laterality Date  . Vein surgery      R Leg    His Family History Is Significant For: Family History  Problem Relation Age of Onset  . Depression Brother   . Mental retardation Brother     His Social History Is Significant For: Social History   Social History  . Marital Status: Single    Spouse Name: N/A  . Number of Children: 1  . Years of Education: N/A   Occupational History  .  [employer]   Social History Main Topics  . Smoking status: Current Every Day Smoker  . Smokeless tobacco: None  . Alcohol Use: 0.0 oz/week    0 Standard drinks or equivalent per week  . Drug Use: No  . Sexual Activity: Not Asked   Other Topics Concern  . None   Social History Narrative   Regular exercise-- yes 3 times a week      Diet: skips breakfast, occ lunch, large dinner, a lot of fast food      Drinks coffee daily     His Allergies Are:  No Known Allergies:   His Current Medications  Are:  No outpatient encounter prescriptions on file as of 07/03/2015.   No facility-administered encounter medications on file as of 07/03/2015.  :  Review of Systems:  Out of a complete 14 point review of systems, all are reviewed and negative with the exception of these symptoms as listed below:   Review of Systems  Neurological:       Patient is here to discuss sleep study. No new concerns.     Objective:  Neurologic Exam  Physical Exam Physical Examination:   Filed Vitals:   07/03/15 1259  BP: 122/70  Pulse: 80  Resp: 18    General Examination: The patient is a very pleasant 35 y.o. male in no  acute distress. He appears well-developed and well-nourished and well groomed. He is in good spirits.   HEENT: Normocephalic, atraumatic, pupils are equal, round and reactive to light and accommodation. Extraocular tracking is good without limitation to gaze excursion or nystagmus noted. Normal smooth pursuit is noted. Hearing is grossly intact. Face is symmetric with normal facial animation and normal facial sensation. Speech is clear with no dysarthria noted. There is no hypophonia. There is no lip, neck/head, jaw or voice tremor. Neck is supple with full range of passive and active motion. There are no carotid bruits on auscultation. Oropharynx exam reveals: mild mouth dryness, adequate dental hygiene and moderate airway crowding, due to narrow airway entry, redundant soft palate, large or uvula and tonsils in place. Tonsils are about 1+ bilaterally. Mallampati is class II. Tongue protrudes centrally and palate elevates symmetrically. He has a Moderate overbite. Nasal inspection reveals no significant nasal mucosal bogginess or redness and no septal deviation.   Chest: Clear to auscultation without wheezing, rhonchi or crackles noted.  Heart: S1+S2+0, regular and normal without murmurs, rubs or gallops noted.   Abdomen: Soft, non-tender and non-distended with normal bowel sounds appreciated on auscultation.  Extremities: There is no pitting edema in the distal lower extremities bilaterally. Pedal pulses are intact.  Skin: Warm and dry without trophic changes noted. There are no varicose veins.  Musculoskeletal: exam reveals no obvious joint deformities, tenderness or joint swelling or erythema.   Neurologically:  Mental status: The patient is awake, alert and oriented in all 4 spheres. His immediate and remote memory, attention, language skills and fund of knowledge are appropriate. There is no evidence of aphasia, agnosia, apraxia or anomia. Speech is clear with normal prosody and enunciation.  Thought process is linear. Mood is normal and affect is normal.  Cranial nerves II - XII are as described above under HEENT exam. In addition: shoulder shrug is normal with equal shoulder height noted. Motor exam: Normal bulk, strength and tone is noted. There is no drift, tremor or rebound. Romberg is negative. Reflexes are 1-2+ throughout. Fine motor skills and coordination: intact with normal finger taps, normal hand movements, normal rapid alternating patting, normal foot taps and normal foot agility.  Cerebellar testing: No dysmetria or intention tremor on finger to nose testing. Heel to shin is unremarkable bilaterally. There is no truncal or gait ataxia.  Sensory exam: intact to light touch in the upper and lower extremities.  Gait, station and balance: He stands easily. No veering to one side is noted. No leaning to one side is noted. Posture is age-appropriate and stance is narrow based. Gait shows normal stride length and normal pace. No problems turning are noted. He turns en bloc.   Assessment and Plan:   In summary, Alex Parsons is a  very pleasant 35 year old male with a underlying medical history of obesity and smoking, whopresents for follow-up consultation of his moderate obstructive sleep apnea by number of events, as determined by his baseline sleep study from December 2016. We talked about his test results in detail today. I talked to him about treatment options including surgical and nonsurgical treatment options. I would like for him to discuss an oral appliance as a potential treatment option for him as he was originally referred by his dentist. He is in agreement. Alternatively, he may do very well with CPAP. He does report some reservation about coming back for another sleep study as he felt he did not sleep well during the sleep study. Alternatively, he may be a good candidate for AutoPap trial at home. I discussed this with him in detail as well today. His physical exam and  neurological exam are stable. At this juncture, he is advised to make a follow-up appointment with Dr. Bennett Scrape for discussion of a dental device for treatment of his sleep apnea. He will let me know and he is encouraged to let me know either way. If he would like to pursue AutoPap we can set him up without at home as well. I answered all his questions today and he was in agreement with the plan. I spent 20 minutes in total face-to-face time with the patient, more than 50% of which was spent in counseling and coordination of care, reviewing test results, reviewing medication and discussing or reviewing the diagnosis of OSA, its prognosis and treatment options.

## 2017-07-03 DIAGNOSIS — F4310 Post-traumatic stress disorder, unspecified: Secondary | ICD-10-CM | POA: Diagnosis not present

## 2017-07-22 DIAGNOSIS — F4310 Post-traumatic stress disorder, unspecified: Secondary | ICD-10-CM | POA: Diagnosis not present

## 2017-08-10 ENCOUNTER — Ambulatory Visit: Payer: Managed Care, Other (non HMO) | Admitting: Family Medicine

## 2017-08-13 DIAGNOSIS — F4310 Post-traumatic stress disorder, unspecified: Secondary | ICD-10-CM | POA: Diagnosis not present

## 2017-08-17 ENCOUNTER — Encounter: Payer: Self-pay | Admitting: Family Medicine

## 2017-08-17 ENCOUNTER — Ambulatory Visit: Payer: BLUE CROSS/BLUE SHIELD | Admitting: Family Medicine

## 2017-08-17 VITALS — BP 122/80 | HR 87 | Temp 97.8°F | Ht 72.0 in | Wt 281.1 lb

## 2017-08-17 DIAGNOSIS — K219 Gastro-esophageal reflux disease without esophagitis: Secondary | ICD-10-CM

## 2017-08-17 DIAGNOSIS — Z0000 Encounter for general adult medical examination without abnormal findings: Secondary | ICD-10-CM | POA: Diagnosis not present

## 2017-08-17 DIAGNOSIS — G4733 Obstructive sleep apnea (adult) (pediatric): Secondary | ICD-10-CM | POA: Diagnosis not present

## 2017-08-17 MED ORDER — OMEPRAZOLE 20 MG PO CPDR: 20.0000 mg | DELAYED_RELEASE_CAPSULE | Freq: Two times a day (BID) | ORAL | 1 refills | Status: DC

## 2017-08-17 NOTE — Patient Instructions (Addendum)
If you do not hear anything about your referral in the next 1-2 weeks, call our office and ask for an update.  The only lifestyle changes that have data behind them are weight loss for the overweight/obese and elevating the head of the bed. Finding out which foods/positions are triggers is important.  Give us 2-3 business days to get the results of your labs back. If labs are normal, you will likely receive a letter in the mail unless you have MyChart. This can take longer than 2-3 business days.   Aim to do some physical exertion for 150 minutes per week. This is typically divided into 5 days per week, 30 minutes per day. The activity should be enough to get your heart rate up. Anything is better than nothing if you have time constraints.  Healthy Eating Plan Many factors influence your heart health, including eating and exercise habits. Heart (coronary) risk increases with abnormal blood fat (lipid) levels. Heart-healthy meal planning includes limiting unhealthy fats, increasing healthy fats, and making other small dietary changes. This includes maintaining a healthy body weight to help keep lipid levels within a normal range.  WHAT IS MY PLAN?  Your health care provider recommends that you:  Drink a glass of water before meals to help with satiety.  Eat slowly.  An alternative to the water is to add Metamucil. This will help with satiety as well. It does contain calories, unlike water.  WHAT TYPES OF FAT SHOULD I CHOOSE?  Choose healthy fats more often. Choose monounsaturated and polyunsaturated fats, such as olive oil and canola oil, flaxseeds, walnuts, almonds, and seeds.  Eat more omega-3 fats. Good choices include salmon, mackerel, sardines, tuna, flaxseed oil, and ground flaxseeds. Aim to eat fish at least two times each week.  Avoid foods with partially hydrogenated oils in them. These contain trans fats. Examples of foods that contain trans fats are stick margarine, some tub  margarines, cookies, crackers, and other baked goods. If you are going to avoid a fat, this is the one to avoid!  WHAT GENERAL GUIDELINES DO I NEED TO FOLLOW?  Check food labels carefully to identify foods with trans fats. Avoid these types of options when possible.  Fill one half of your plate with vegetables and green salads. Eat 4-5 servings of vegetables per day. A serving of vegetables equals 1 cup of raw leafy vegetables,  cup of raw or cooked cut-up vegetables, or  cup of vegetable juice.  Fill one fourth of your plate with whole grains. Look for the word "whole" as the first word in the ingredient list.  Fill one fourth of your plate with lean protein foods.  Eat 4-5 servings of fruit per day. A serving of fruit equals one medium whole fruit,  cup of dried fruit,  cup of fresh, frozen, or canned fruit. Try to avoid fruits in cups/syrups as the sugar content can be high.  Eat more foods that contain soluble fiber. Examples of foods that contain this type of fiber are apples, broccoli, carrots, beans, peas, and barley. Aim to get 20-30 g of fiber per day.  Eat more home-cooked food and less restaurant, buffet, and fast food.  Limit or avoid alcohol.  Limit foods that are high in starch and sugar.  Avoid fried foods when able.  Cook foods by using methods other than frying. Baking, boiling, grilling, and broiling are all great options. Other fat-reducing suggestions include: ? Removing the skin from poultry. ? Removing all visible fats  from meats. ? Skimming the fat off of stews, soups, and gravies before serving them. ? Steaming vegetables in water or broth.  Lose weight if you are overweight. Losing just 5-10% of your initial body weight can help your overall health and prevent diseases such as diabetes and heart disease.  Increase your consumption of nuts, legumes, and seeds to 4-5 servings per week. One serving of dried beans or legumes equals  cup after being cooked,  one serving of nuts equals 1 ounces, and one serving of seeds equals  ounce or 1 tablespoon.  WHAT ARE GOOD FOODS CAN I EAT? Grains Grainy breads (try to find bread that is 3 g of fiber per slice or greater), oatmeal, light popcorn. Whole-grain cereals. Rice and pasta, including brown rice and those that are made with whole wheat. Edamame pasta is a great alternative to grain pasta. It has a higher protein content. Try to avoid significant consumption of white bread, sugary cereals, or pastries/baked goods.  Vegetables All vegetables. Cooked white potatoes do not count as vegetables.  Fruits All fruits, but limit pineapple and bananas as these fruits have a higher sugar content.  Meats and Other Protein Sources Lean, well-trimmed beef, veal, pork, and lamb. Chicken and [place] without skin. All fish and shellfish. Wild duck, rabbit, pheasant, and venison. Egg whites or low-cholesterol egg substitutes. Dried beans, peas, lentils, and tofu.Seeds and most nuts.  Dairy Low-fat or nonfat cheeses, including ricotta, string, and mozzarella. Skim or 1% milk that is liquid, powdered, or evaporated. Buttermilk that is made with low-fat milk. Nonfat or low-fat yogurt. Soy/Almond milk are good alternatives if you cannot handle dairy.  Beverages Water is the best for you. Sports drinks with less sugar are more desirable unless you are a highly active athlete.  Sweets and Desserts Sherbets and fruit ices. Honey, jam, marmalade, jelly, and syrups. Dark chocolate.  Eat all sweets and desserts in moderation.  Fats and Oils Nonhydrogenated (trans-free) margarines. Vegetable oils, including soybean, sesame, sunflower, olive, peanut, safflower, corn, canola, and cottonseed. Salad dressings or mayonnaise that are made with a vegetable oil. Limit added fats and oils that you use for cooking, baking, salads, and as spreads.  Other Cocoa powder. Coffee and tea. Most condiments.  The items listed above may  not be a complete list of recommended foods or beverages. Contact your dietitian for more options.

## 2017-08-17 NOTE — Progress Notes (Signed)
Chief Complaint  Patient presents with  . Establish Care    Well Male Alex Parsons is here for a complete physical.   His last physical was >1 year ago.  Current diet: in general, an "unhealthy" diet   Current exercise: cardio Weight trend: stable; trying to lose some Does pt snore? Yes. Daytime fatigue? Yes. Seat belt? Yes.    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 He uses Tums frequently. Worse after eating [place] food and laying down. His wt gain did not help this issue. He tries to sleep with 2 pillows. Pt tried PPI OTC that did help, but s/s's came back. No bleeding, N/V, or wt loss.   OSA Dx'd with mild OSA, referred to specialist who was going to give an oral appliance vs CPAP but the out of pocket cost was too great. He also is not sure that his in lab sleep assessment was accurate and would like to see another provider. He has gained some wt in the past years and had not had these issues prior to the gain.   Past Medical History:  Diagnosis Date  . Anxiety   . Depression   . GERD (gastroesophageal reflux disease)   . History of chicken pox     Past Surgical History:  Procedure Laterality Date  . VEIN SURGERY     R Leg   Medications  Takes no meds routinely.   Allergies No Known Allergies Family History Family History  Problem Relation Age of Onset  . Depression Brother   . Mental retardation Brother     Review of Systems: Constitutional: no fevers or chills Eye:  no recent significant change in vision Ear/Nose/Mouth/Throat:  Ears:  no tinnitus or hearing loss Nose/Mouth/Throat:  no complaints of nasal congestion or bleeding, no sore throat Cardiovascular:  no chest pain, no palpitations Respiratory:  no cough and no shortness of breath Gastrointestinal: +Reflux; otherwise no abdominal pain, no change in bowel habits, no nausea, vomiting, diarrhea, or constipation and no  black or bloody stool GU:  Male: negative for dysuria, frequency, and incontinence and negative for prostate symptoms Musculoskeletal/Extremities:  no pain, redness, or swelling of the joints Integumentary (Skin/Breast):  no abnormal skin lesions reported Neurologic:  no headaches, no numbness, tingling Endocrine: No unexpected weight changes Hematologic/Lymphatic:  no night sweats  Exam BP 122/80 (BP Location: Left Arm, Patient Position: Sitting, Cuff Size: Large)   Pulse 87   Temp 97.8 F (36.6 C) (Oral)   Ht 6' (1.829 m)   Wt 281 lb 2 oz (127.5 kg)   SpO2 99%   BMI 38.13 kg/m  General:  well developed, well nourished, in no apparent distress Skin:  no significant moles, warts, or growths Head:  no masses, lesions, or tenderness Eyes:  pupils equal and round, sclera anicteric without injection Ears:  canals without lesions, TMs shiny without retraction, no obvious effusion, no erythema Nose:  nares patent, septum midline, mucosa normal Throat/Pharynx:  lips and gingiva without lesion; tongue and uvula midline; non-inflamed pharynx; no exudates or postnasal drainage Neck: neck supple without adenopathy, thyromegaly, or masses Lungs:  clear to auscultation, breath sounds equal bilaterally, no respiratory distress Cardio:  regular rate and rhythm without murmurs, heart sounds without clicks or rubs Abdomen:  abdomen soft, nontender; bowel sounds normal; no masses or organomegaly Genital (male): circumcised penis, no lesions or discharge; testes present bilaterally without masses or tenderness  Rectal: Deferred Musculoskeletal:  symmetrical muscle groups noted without atrophy or deformity Extremities:  no clubbing, cyanosis, or edema, no deformities, no skin discoloration  Neuro:  gait normal; deep tendon reflexes normal and symmetric Psych: well oriented with normal range of affect and appropriate judgment/insight  Assessment and Plan  Well adult exam - Plan: Comprehensive  metabolic panel, Lipid panel  Gastroesophageal reflux disease, esophagitis presence not specified - Plan: omeprazole (PRILOSEC) 20 MG capsule  OSA (obstructive sleep apnea) - Plan: Ambulatory referral to Pulmonology   Well 37 y.o. male.  Counseled on diet and exercise. Healthy diet handout given. 60 d of PPI. Home items rec'd as well. Refer to our pulm team, he was not pleased with the [facility] team.  Other orders as above. Return for fasting labs.  Follow up in 1 year pending the above workup. The patient voiced understanding and agreement to the plan.  Jilda Rocheicholas Paul [place], DO 08/17/17 12:17 PM

## 2017-08-17 NOTE — Progress Notes (Signed)
Pre visit review using our clinic review tool, if applicable. No additional management support is needed unless otherwise documented below in the visit note. 

## 2017-08-19 ENCOUNTER — Other Ambulatory Visit: Payer: BLUE CROSS/BLUE SHIELD

## 2017-08-19 ENCOUNTER — Encounter: Payer: Self-pay | Admitting: Family Medicine

## 2017-08-21 ENCOUNTER — Other Ambulatory Visit (INDEPENDENT_AMBULATORY_CARE_PROVIDER_SITE_OTHER): Payer: BLUE CROSS/BLUE SHIELD

## 2017-08-21 DIAGNOSIS — Z0000 Encounter for general adult medical examination without abnormal findings: Secondary | ICD-10-CM

## 2017-08-21 LAB — COMPREHENSIVE METABOLIC PANEL
ALT: 26 U/L (ref 0–53)
AST: 22 U/L (ref 0–37)
Albumin: 3.9 g/dL (ref 3.5–5.2)
Alkaline Phosphatase: 71 U/L (ref 39–117)
BILIRUBIN TOTAL: 0.4 mg/dL (ref 0.2–1.2)
BUN: 13 mg/dL (ref 6–23)
CALCIUM: 9.4 mg/dL (ref 8.4–10.5)
CO2: 27 meq/L (ref 19–32)
CREATININE: 1.02 mg/dL (ref 0.40–1.50)
Chloride: 103 mEq/L (ref 96–112)
GFR: 104.63 mL/min (ref >60.00)
Glucose, Bld: 90 mg/dL (ref 70–99)
Potassium: 3.7 mEq/L (ref 3.5–5.1)
Sodium: 140 mEq/L (ref 135–145)
Total Protein: 7.2 g/dL (ref 6.0–8.3)

## 2017-08-21 LAB — LIPID PANEL
CHOL/HDL RATIO: 4
CHOLESTEROL: 172 mg/dL (ref 0–200)
HDL: 44.8 mg/dL (ref >39.00)
LDL Cholesterol: 93 mg/dL (ref 0–99)
NonHDL: 127.24
TRIGLYCERIDES: 172 mg/dL — AB (ref 0.0–149.0)
VLDL: 34.4 mg/dL (ref 0.0–40.0)

## 2017-08-24 ENCOUNTER — Encounter: Payer: Self-pay | Admitting: Family Medicine

## 2017-09-04 DIAGNOSIS — F4310 Post-traumatic stress disorder, unspecified: Secondary | ICD-10-CM | POA: Diagnosis not present

## 2017-09-16 DIAGNOSIS — F4310 Post-traumatic stress disorder, unspecified: Secondary | ICD-10-CM | POA: Diagnosis not present

## 2017-09-22 MED FILL — SERTRALINE HCL 50 MG TABLET: 50 | 30 days supply | Qty: 30 | Fill #0

## 2017-09-30 DIAGNOSIS — F4310 Post-traumatic stress disorder, unspecified: Secondary | ICD-10-CM | POA: Diagnosis not present

## 2017-10-10 DIAGNOSIS — J0110 Acute frontal sinusitis, unspecified: Secondary | ICD-10-CM | POA: Diagnosis not present

## 2017-10-10 DIAGNOSIS — J309 Allergic rhinitis, unspecified: Secondary | ICD-10-CM | POA: Diagnosis not present

## 2017-10-20 DIAGNOSIS — F4310 Post-traumatic stress disorder, unspecified: Secondary | ICD-10-CM | POA: Diagnosis not present

## 2017-12-21 ENCOUNTER — Ambulatory Visit (HOSPITAL_BASED_OUTPATIENT_CLINIC_OR_DEPARTMENT_OTHER)
Admission: RE | Admit: 2017-12-21 | Discharge: 2017-12-21 | Disposition: A | Discharge status: Home / Self Care | Payer: BLUE CROSS/BLUE SHIELD | Source: Ambulatory Visit | Attending: Family Medicine | Admitting: Family Medicine

## 2017-12-21 ENCOUNTER — Ambulatory Visit (INDEPENDENT_AMBULATORY_CARE_PROVIDER_SITE_OTHER): Payer: BLUE CROSS/BLUE SHIELD | Admitting: Family Medicine

## 2017-12-21 ENCOUNTER — Encounter: Payer: Self-pay | Admitting: Family Medicine

## 2017-12-21 VITALS — BP 108/80 | HR 89 | Temp 98.5°F | Ht 72.0 in | Wt 271.0 lb

## 2017-12-21 DIAGNOSIS — S060X1A Concussion with loss of consciousness of 30 minutes or less, initial encounter: Secondary | ICD-10-CM | POA: Diagnosis not present

## 2017-12-21 DIAGNOSIS — G44319 Acute post-traumatic headache, not intractable: Secondary | ICD-10-CM | POA: Diagnosis not present

## 2017-12-21 DIAGNOSIS — R58 Hemorrhage, not elsewhere classified: Secondary | ICD-10-CM | POA: Diagnosis not present

## 2017-12-21 DIAGNOSIS — X58XXXA Exposure to other specified factors, initial encounter: Secondary | ICD-10-CM | POA: Diagnosis not present

## 2017-12-21 DIAGNOSIS — S0003XA Contusion of scalp, initial encounter: Secondary | ICD-10-CM | POA: Insufficient documentation

## 2017-12-21 DIAGNOSIS — Z23 Encounter for immunization: Secondary | ICD-10-CM

## 2017-12-21 NOTE — Progress Notes (Signed)
Pre visit review using our clinic review tool, if applicable. No additional management support is needed unless otherwise documented below in the visit note. 

## 2017-12-21 NOTE — Patient Instructions (Addendum)
We will get your scan today.    Concussion, Adult A concussion is a brain injury from a direct hit (blow) to the head or body. This blow causes the brain to shake quickly back and forth inside the skull. This can damage brain cells and cause chemical changes in the brain. A concussion may also be known as a mild traumatic brain injury (TBI). Concussions are usually not life-threatening, but the effects of a concussion can be serious. If you have a concussion, you are more likely to experience concussion-like symptoms after a direct blow to the head in the future. What are the causes? This condition is caused by:  A direct blow to the head, such as from running into another player during a game, being hit in a fight, or hitting your head on a hard surface.  A jolt of the head or neck that causes the brain to move back and forth inside the skull, such as in a car crash.  What are the signs or symptoms? The signs of a concussion can be hard to notice. Early on, they may be missed by you, family members, and health care providers. You may look fine but act or feel differently. Symptoms are usually temporary, but they may last for days, weeks, or even longer. Some symptoms may appear right away but other symptoms may not show up for hours or days. Every head injury is different. Symptoms may include:  Headaches. This can include a feeling of pressure in the head.  Memory problems.  Trouble concentrating, organizing, or making decisions.  Slowness in thinking, acting or reacting, speaking, or reading.  Confusion.  Fatigue.  Changes in eating or sleeping patterns.  Problems with coordination or balance.  Nausea or vomiting.  Numbness or tingling.  Sensitivity to light or noise.  Vision or hearing problems.  Reduced sense of smell.  Irritability or mood changes.  Dizziness.  Lack of motivation.  Seeing or hearing things that other people do not see or hear  (hallucinations).  How is this diagnosed? This condition is diagnosed based on:  Your symptoms.  A description of your injury.  You may also have tests, including:  Imaging tests, such as a CT scan or MRI. These are done to look for signs of brain injury.  Neuropsychological tests. These measure your thinking, understanding, learning, and remembering abilities.  How is this treated? This condition is treated with physical and mental rest and careful observation, usually at home. If the concussion is severe, you may need to stay home from work for a while. You may be referred to a concussion clinic or to other health care providers for management. It is important that you tell your health care provider if:  You are taking any medicines, including prescription medicines, over-the-counter medicines, and natural remedies. Some medicines, such as blood thinners (anticoagulants) and aspirin, may increase the chance of complications, such as bleeding.  You are taking or have taken alcohol or illegal drugs. Alcohol and certain other drugs may slow your recovery and can put you at risk of further injury.  How fast you will recover from a concussion depends on many factors, such as how severe your concussion is, what part of your brain was injured, how old you are, and how healthy you were before the concussion. Recovery can take time. It is important to wait to return to activity until a health care provider says it is safe to do that and your symptoms are completely gone. Follow  these instructions at home: Activity  Limit activities that require a lot of thought or concentration. These may include: ? Doing homework or job-related work. ? Watching TV. ? Working on the computer. ? Playing memory games and puzzles.  Rest. Rest helps the brain to heal. Make sure you: ? Get plenty of sleep at night. Avoid staying up late at night. ? Keep the same bedtime hours on weekends and weekdays. ? Rest  during the day. Take naps or rest breaks when you feel tired.  Having another concussion before the first one has healed can be dangerous. Do not do high-risk activities that could cause a second concussion, such as riding a bicycle or playing sports.  Ask your health care provider when you can return to your normal activities, such as school, work, athletics, driving, riding a bicycle, or using heavy machinery. Your ability to react may be slower after a brain injury. Never do these activities if you are dizzy. Your health care provider will likely give you a plan for gradually returning to activities. General instructions  Take over-the-counter and prescription medicines only as told by your health care provider.  Do not drink alcohol until your health care provider says you can.  If it is harder than usual to remember things, write them down.  If you are easily distracted, try to do one thing at a time. For example, do not try to watch TV while fixing dinner.  Talk with family members or close friends when making important decisions.  Watch your symptoms and tell others to do the same. Complications sometimes occur after a concussion. Older adults with a brain injury may have a higher risk of serious complications, such as a blood clot in the brain.  Tell your teachers, school nurse, school counselor, coach, athletic trainer, or work Production designer, theatre/television/filmmanager about your injury, symptoms, and restrictions. Tell them about what you can or cannot do. They should watch for: ? Increased problems with attention or concentration. ? Increased difficulty remembering or learning new information. ? Increased time needed to complete tasks or assignments. ? Increased irritability or decreased ability to cope with stress. ? Increased symptoms.  Keep all follow-up visits as told by your health care provider. This is important. How is this prevented? It is very important to avoid another brain injury, especially as you  recover. In rare cases, another injury can lead to permanent brain damage, brain swelling, or death. The risk of this is greatest during the first 7-10 days after a head injury. Avoid injuries by:  Wearing a seat belt when riding in a car.  Wearing a helmet when biking, skiing, skateboarding, skating, or doing similar activities.  Avoiding activities that could lead to a second concussion, such as contact or recreational sports, until your health care provider says it is okay.  Taking safety measures in your home, such as: ? Removing clutter and tripping hazards from floors and stairways. ? Using grab bars in bathrooms and handrails by stairs. ? Placing non-slip mats on floors and in bathtubs. ? Improving lighting in dim areas.  Contact a health care provider if:  Your symptoms get worse.  You have new symptoms.  You continue to have symptoms for more than 2 weeks. Get help right away if:  You have severe or worsening headaches.  You have weakness or numbness in any part of your body.  Your coordination gets worse.  You vomit repeatedly.  You are sleepier.  The pupil of one eye is larger  than the other.  You have convulsions or a seizure.  Your speech is slurred.  Your fatigue, confusion, or irritability gets worse.  You cannot recognize people or places.  You have neck pain.  It is difficult to wake you up.  You have unusual behavior changes.  You lose consciousness. Summary  A concussion is a brain injury from a direct hit (blow) to the head or body.  A concussion may also be called a mild traumatic brain injury (TBI).  You may have imaging tests and neuropsychological tests to diagnose a concussion.  This condition is treated with physical and mental rest and careful observation.  Ask your health care provider when you can return to your normal activities, such as school, work, athletics, driving, riding a bicycle, or using heavy machinery. Follow safety  instructions as told by your health care provider. This information is not intended to replace advice given to you by your health care provider. Make sure you discuss any questions you have with your health care provider. Document Released: 08/30/2003 Document Revised: 05/20/2016 Document Reviewed: 05/20/2016 Elsevier Interactive Patient Education  [employer].
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
## 2017-12-25 ENCOUNTER — Ambulatory Visit: Payer: Self-pay | Admitting: Family Medicine

## 2017-12-25 NOTE — Telephone Encounter (Signed)
PEC phoned author re: pt's concern over CT results and bleeding from scalp. CT results reviewed with pt.. Pt. denies HA/vison problems/N/V. Pt. said that he was concerned about the laceration to his scalp, because it seemingly opened up after his shower this AM. Pt. States it is not a lot of blood, but author offered to make an appointment for pt to assess site, and pt. Declined at this time. "You know, I think I over-reacted", pt. states. Author encouraged pt. To avoid touching area, keeping site clean and dry, and stated that if he starts to feel unwell, or notices bleeding that will not stop, to seek medical care. Pt. verbalized understanding.

## 2017-12-25 NOTE — Telephone Encounter (Signed)
I am happy to take a look, but it is too late for sutures/staples. Keep the area clean and dry. Direct pressure to help with bleeding. Ty.
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
## 2017-12-29 NOTE — Telephone Encounter (Signed)
Left pt a message Notifying him of instruction and to call and schedule an appointment.

## 2018-01-01 ENCOUNTER — Ambulatory Visit: Payer: BLUE CROSS/BLUE SHIELD | Admitting: Family Medicine

## 2018-01-01 ENCOUNTER — Encounter: Payer: Self-pay | Admitting: Family Medicine

## 2018-01-01 VITALS — BP 137/86 | HR 74 | Temp 98.7°F | Ht 72.0 in | Wt 276.0 lb

## 2018-01-01 DIAGNOSIS — S060X1A Concussion with loss of consciousness of 30 minutes or less, initial encounter: Secondary | ICD-10-CM

## 2018-01-01 DIAGNOSIS — T148XXA Other injury of unspecified body region, initial encounter: Secondary | ICD-10-CM | POA: Diagnosis not present

## 2018-01-01 DIAGNOSIS — J3489 Other specified disorders of nose and nasal sinuses: Secondary | ICD-10-CM | POA: Diagnosis not present

## 2018-01-01 MED ORDER — SILVER SULFADIAZINE 1 % EX CREA: 1.0000 "application " | TOPICAL_CREAM | Freq: Every day | CUTANEOUS | 0 refills | Status: DC

## 2018-01-01 MED FILL — SSD 1% CREAM: 1 | 30 days supply | Qty: 50 | Fill #0
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
## 2018-01-01 NOTE — Patient Instructions (Addendum)
Try to keep the area clean and dry.  Use this cream daily. Keep it covered to protect your pillow cases.   You don't need to do anything about your nose.   Let Alex Parsons know if you need anything.

## 2018-01-05 ENCOUNTER — Encounter: Payer: Self-pay | Admitting: Family Medicine

## 2018-02-10 ENCOUNTER — Encounter: Payer: Self-pay | Admitting: Family Medicine

## 2018-02-10 ENCOUNTER — Ambulatory Visit (INDEPENDENT_AMBULATORY_CARE_PROVIDER_SITE_OTHER): Payer: BLUE CROSS/BLUE SHIELD | Admitting: Family Medicine

## 2018-02-10 VITALS — BP 128/80 | HR 87 | Temp 98.6°F | Ht 72.0 in | Wt 272.0 lb

## 2018-02-10 DIAGNOSIS — G44329 Chronic post-traumatic headache, not intractable: Secondary | ICD-10-CM | POA: Diagnosis not present

## 2018-02-10 MED ORDER — KETOROLAC TROMETHAMINE 60 MG/2ML IM SOLN
60.0000 mg | Freq: Once | INTRAMUSCULAR | Status: AC
  Administered 2018-02-10: 60 mg via INTRAMUSCULAR

## 2018-02-10 MED ORDER — PROPRANOLOL HCL ER 60 MG PO CP24: 60.0000 mg | ORAL_CAPSULE | Freq: Every day | ORAL | 1 refills | Status: DC

## 2018-02-10 MED FILL — PROPRANOLOL ER 60 MG CAP: 60 | 30 days supply | Qty: 30 | Fill #0

## 2018-02-10 NOTE — Patient Instructions (Addendum)
Drop off any paperwork to our office. There may be a fee associated with filling out the papers depending on how extensive it is.   If you do not hear anything about your referral in the next 1-2 weeks, call our office and ask for an update.  OK to take Tylenol 1000 mg (2 extra strength tabs) or 975 mg (3 regular strength tabs) every 6 hours as needed.  No NSAIDs for the rest of today.  Heat (pad or rice pillow in microwave) over affected area, 10-15 minutes twice daily.   Let us know if you need anything.  EXERCISES RANGE OF MOTION (ROM) AND STRETCHING EXERCISES  These exercises may help you when beginning to rehabilitate your issue. In order to successfully resolve your symptoms, you must improve your posture. These exercises are designed to help reduce the forward-head and rounded-shoulder posture which contributes to this condition. Your symptoms may resolve with or without further involvement from your physician, physical therapist or athletic trainer. While completing these exercises, remember:   Restoring tissue flexibility helps normal motion to return to the joints. This allows healthier, less painful movement and activity.  An effective stretch should be held for at least 20 seconds, although you may need to begin with shorter hold times for comfort.  A stretch should never be painful. You should only feel a gentle lengthening or release in the stretched tissue.  Do not do any stretch or exercise that you cannot tolerate.  STRETCH- Axial Extensors  Lie on your back on the floor. You may bend your knees for comfort. Place a rolled-up hand towel or dish towel, about 2 inches in diameter, under the part of your head that makes contact with the floor.  Gently tuck your chin, as if trying to make a "double chin," until you feel a gentle stretch at the base of your head.  Hold 15-20 seconds. Repeat 2-3 times. Complete this exercise 1 time per day.   STRETCH - Axial Extension    Stand or sit on a firm surface. Assume a good posture: chest up, shoulders drawn back, abdominal muscles slightly tense, knees unlocked (if standing) and feet hip width apart.  Slowly retract your chin so your head slides back and your chin slightly lowers. Continue to look straight ahead.  You should feel a gentle stretch in the back of your head. Be certain not to feel an aggressive stretch since this can cause headaches later.  Hold for 15-20 seconds. Repeat 2-3 times. Complete this exercise 1 time per day.  STRETCH - Cervical Side Bend   Stand or sit on a firm surface. Assume a good posture: chest up, shoulders drawn back, abdominal muscles slightly tense, knees unlocked (if standing) and feet hip width apart.  Without letting your nose or shoulders move, slowly tip your right / left ear to your shoulder until your feel a gentle stretch in the muscles on the opposite side of your neck.  Hold 15-20 seconds. Repeat 2-3 times. Complete this exercise 1-2 times per day.  STRETCH - Cervical Rotators   Stand or sit on a firm surface. Assume a good posture: chest up, shoulders drawn back, abdominal muscles slightly tense, knees unlocked (if standing) and feet hip width apart.  Keeping your eyes level with the ground, slowly turn your head until you feel a gentle stretch along the back and opposite side of your neck.  Hold 15-20 seconds. Repeat 2-3 times. Complete this exercise 1-2 times per day.  RANGE OF  MOTION - Neck Circles   Stand or sit on a firm surface. Assume a good posture: chest up, shoulders drawn back, abdominal muscles slightly tense, knees unlocked (if standing) and feet hip width apart.  Gently roll your head down and around from the back of one shoulder to the back of the other. The motion should never be forced or painful.  Repeat the motion 10-20 times, or until you feel the neck muscles relax and loosen. Repeat 2-3 times. Complete the exercise 1-2 times per  day. STRENGTHENING EXERCISES - Cervical Strain and Sprain These exercises may help you when beginning to rehabilitate your injury. They may resolve your symptoms with or without further involvement from your physician, physical therapist, or athletic trainer. While completing these exercises, remember:   Muscles can gain both the endurance and the strength needed for everyday activities through controlled exercises.  Complete these exercises as instructed by your physician, physical therapist, or athletic trainer. Progress the resistance and repetitions only as guided.  You may experience muscle soreness or fatigue, but the pain or discomfort you are trying to eliminate should never worsen during these exercises. If this pain does worsen, stop and make certain you are following the directions exactly. If the pain is still present after adjustments, discontinue the exercise until you can discuss the trouble with your clinician.  STRENGTH - Cervical Flexors, Isometric  Face a wall, standing about 6 inches away. Place a small pillow, a ball about 6-8 inches in diameter, or a folded towel between your forehead and the wall.  Slightly tuck your chin and gently push your forehead into the soft object. Push only with mild to moderate intensity, building up tension gradually. Keep your jaw and forehead relaxed.  Hold 10 to 20 seconds. Keep your breathing relaxed.  Release the tension slowly. Relax your neck muscles completely before you start the next repetition. Repeat 2-3 times. Complete this exercise 1 time per day.  STRENGTH- Cervical Lateral Flexors, Isometric   Stand about 6 inches away from a wall. Place a small pillow, a ball about 6-8 inches in diameter, or a folded towel between the side of your head and the wall.  Slightly tuck your chin and gently tilt your head into the soft object. Push only with mild to moderate intensity, building up tension gradually. Keep your jaw and forehead  relaxed.  Hold 10 to 20 seconds. Keep your breathing relaxed.  Release the tension slowly. Relax your neck muscles completely before you start the next repetition. Repeat 2-3 times. Complete this exercise 1 time per day.  STRENGTH - Cervical Extensors, Isometric   Stand about 6 inches away from a wall. Place a small pillow, a ball about 6-8 inches in diameter, or a folded towel between the back of your head and the wall.  Slightly tuck your chin and gently tilt your head back into the soft object. Push only with mild to moderate intensity, building up tension gradually. Keep your jaw and forehead relaxed.  Hold 10 to 20 seconds. Keep your breathing relaxed.  Release the tension slowly. Relax your neck muscles completely before you start the next repetition. Repeat 2-3 times. Complete this exercise 1 time per day.  POSTURE AND BODY MECHANICS CONSIDERATIONS Keeping correct posture when sitting, standing or completing your activities will reduce the stress put on different body tissues, allowing injured tissues a chance to heal and limiting painful experiences. The following are general guidelines for improved posture. Your physician or physical therapist will provide  you with any instructions specific to your needs. While reading these guidelines, remember:  The exercises prescribed by your provider will help you have the flexibility and strength to maintain correct postures.  The correct posture provides the optimal environment for your joints to work. All of your joints have less wear and tear when properly supported by a spine with good posture. This means you will experience a healthier, less painful body.  Correct posture must be practiced with all of your activities, especially prolonged sitting and standing. Correct posture is as important when doing repetitive low-stress activities (typing) as it is when doing a single heavy-load activity (lifting).  PROLONGED STANDING WHILE SLIGHTLY  LEANING FORWARD When completing a task that requires you to lean forward while standing in one place for a long time, place either foot up on a stationary 2- to 4-inch high object to help maintain the best posture. When both feet are on the ground, the low back tends to lose its slight inward curve. If this curve flattens (or becomes too large), then the back and your other joints will experience too much stress, fatigue more quickly, and can cause pain.   RESTING POSITIONS Consider which positions are most painful for you when choosing a resting position. If you have pain with flexion-based activities (sitting, bending, stooping, squatting), choose a position that allows you to rest in a less flexed posture. You would want to avoid curling into a fetal position on your side. If your pain worsens with extension-based activities (prolonged standing, working overhead), avoid resting in an extended position such as sleeping on your stomach. Most people will find more comfort when they rest with their spine in a more neutral position, neither too rounded nor too arched. Lying on a non-sagging bed on your side with a pillow between your knees, or on your back with a pillow under your knees will often provide some relief. Keep in mind, being in any one position for a prolonged period of time, no matter how correct your posture, can still lead to stiffness.  WALKING Walk with an upright posture. Your ears, shoulders, and hips should all line up. OFFICE WORK When working at a desk, create an environment that supports good, upright posture. Without extra support, muscles fatigue and lead to excessive strain on joints and other tissues.  CHAIR:  A chair should be able to slide under your desk when your back makes contact with the back of the chair. This allows you to work closely.  The chair's height should allow your eyes to be level with the upper part of your monitor and your hands to be slightly lower than  your elbows.  Body position: ? Your feet should make contact with the floor. If this is not possible, use a foot rest. ? Keep your ears over your shoulders. This will reduce stress on your neck and low back.
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
## 2018-02-10 NOTE — Progress Notes (Signed)
Pre visit review using our clinic review tool, if applicable. No additional management support is needed unless otherwise documented below in the visit note. 

## 2018-02-25 ENCOUNTER — Telehealth: Payer: Self-pay | Admitting: Family Medicine

## 2018-02-25 NOTE — Telephone Encounter (Signed)
Pt dropped off FMLA paperwork. Put in Dr. Hollie Beach bin in the front office. I did let him know provider was out of office.

## 2018-03-08 NOTE — Telephone Encounter (Signed)
Received FMLA/STD paperwork from [employer], completed as much as possible; forwarded to provider/SLS 09/16   

## 2018-03-24 ENCOUNTER — Ambulatory Visit: Payer: BLUE CROSS/BLUE SHIELD | Admitting: Family Medicine

## 2018-04-01 ENCOUNTER — Encounter: Payer: Self-pay | Admitting: Family Medicine

## 2018-04-01 ENCOUNTER — Ambulatory Visit (INDEPENDENT_AMBULATORY_CARE_PROVIDER_SITE_OTHER): Payer: BLUE CROSS/BLUE SHIELD | Admitting: Family Medicine

## 2018-04-01 VITALS — BP 130/80 | HR 84 | Temp 98.7°F | Ht 72.0 in | Wt 275.0 lb

## 2018-04-01 DIAGNOSIS — G44329 Chronic post-traumatic headache, not intractable: Secondary | ICD-10-CM | POA: Diagnosis not present

## 2018-04-01 DIAGNOSIS — F0781 Postconcussional syndrome: Secondary | ICD-10-CM

## 2018-04-01 MED ORDER — VENLAFAXINE HCL ER 37.5 MG PO CP24: 37.5000 mg | ORAL_CAPSULE | Freq: Every day | ORAL | 1 refills | Status: DC

## 2018-04-01 MED FILL — VENLAFAXINE HCL ER 37.5 MG: 37.5 | 30 days supply | Qty: 30 | Fill #0

## 2018-04-01 NOTE — Patient Instructions (Addendum)
If you do not hear anything about your referral in the next 1-2 weeks, call our office and ask for an update.  Stop the propranolol.  Let us know if you need anything.  Post-Concussion Syndrome Post-concussion syndrome describes the symptoms that can occur after a head injury. These symptoms can last from weeks to months. What are the causes? It is not clear why some head injuries cause post-concussion syndrome. It can occur whether your head injury was mild or severe and whether you were wearing head protection or not. What are the signs or symptoms?  Memory difficulties.  Dizziness.  Headaches.  Double vision or blurry vision.  Sensitivity to light.  Hearing difficulties.  Depression.  Tiredness.  Weakness.  Difficulty with concentration.  Difficulty sleeping or staying asleep.  Vomiting.  Poor balance or instability on your feet.  Slow reaction time.  Difficulty learning and remembering things you have heard. How is this diagnosed? There is no test to determine whether you have post-concussion syndrome. Your health care provider may order an imaging scan of your brain, such as a CT scan, to check for other problems that may be causing your symptoms (such as a severe injury inside your skull). How is this treated? Usually, these problems disappear over time without medical care. Your health care provider may prescribe medicine to help ease your symptoms. It is important to follow up with a neurologist to evaluate your recovery and address any lingering symptoms or issues. Follow these instructions at home:  Take medicines only as directed by your health care provider. Do not take aspirin. Aspirin can slow blood clotting.  Sleep with your head slightly elevated to help with headaches.  Avoid any situation where there is potential for another head injury. This includes football, hockey, soccer, basketball, martial arts, downhill snow sports, and horseback riding.  Your condition will get worse every time you experience a concussion. You should avoid these activities until you are evaluated by the appropriate follow-up health care providers.  Keep all follow-up visits as directed by your health care provider. This is important. Contact a health care provider if:  You have increased problems paying attention or concentrating.  You have increased difficulty remembering or learning new information.  You need more time to complete tasks or assignments than before.  You have increased irritability or decreased ability to cope with stress.  You have more symptoms than before. Seek medical care if you have any of the following symptoms for more than two weeks after your injury:  Lasting (chronic) headaches.  Dizziness or balance problems.  Nausea.  Vision problems.  Increased sensitivity to noise or light.  Depression or mood swings.  Anxiety or irritability.  Memory problems.  Difficulty concentrating or paying attention.  Sleep problems.  Feeling tired all the time.  Get help right away if:  You have confusion or unusual drowsiness.  Others find it difficult to wake you up.  You have nausea or persistent, forceful vomiting.  You feel like you are moving when you are not (vertigo). Your eyes may move rapidly back and forth.  You have convulsions or faint.  You have severe, persistent headaches that are not relieved by medicine.  You cannot use your arms or legs normally.  One of your pupils is larger than the other.  You have clear or bloody discharge from your nose or ears.  Your problems are getting worse, not better. This information is not intended to replace advice given to you by  your health care provider. Make sure you discuss any questions you have with your health care provider. Document Released: 11/29/2001 Document Revised: 12/28/2015 Document Reviewed: 09/14/2013 Elsevier Interactive Patient Education  2018  [employer].
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
## 2018-04-01 NOTE — Progress Notes (Signed)
Pre visit review using our clinic review tool, if applicable. No additional management support is needed unless otherwise documented below in the visit note. 

## 2018-04-08 ENCOUNTER — Telehealth: Payer: Self-pay | Admitting: *Deleted

## 2018-04-08 DIAGNOSIS — Z0279 Encounter for issue of other medical certificate: Secondary | ICD-10-CM

## 2018-04-08 NOTE — Telephone Encounter (Signed)
Completed as much as possible of Physical Capabilities and Limitations forms; forwarded to provider/SLS

## 2018-04-09 NOTE — Telephone Encounter (Signed)
Received completed form from provider; faxed with confirmation/SLS 10/17

## 2018-04-15 ENCOUNTER — Ambulatory Visit: Payer: BLUE CROSS/BLUE SHIELD | Admitting: Family Medicine

## 2018-04-15 DIAGNOSIS — Z0289 Encounter for other administrative examinations: Secondary | ICD-10-CM

## 2018-07-22 ENCOUNTER — Telehealth: Payer: Self-pay | Admitting: Family Medicine

## 2018-07-22 NOTE — Telephone Encounter (Signed)
Have called this patient back and no numbers work. The patient should go to the [place] office ([address] [place] ). And go to medical records (in the basement of building)///sign a release of records to get these notes. Ok to give the patient this message
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
## 2018-07-22 NOTE — Telephone Encounter (Signed)
I'm OK with that, he might need to sign a records release form and/or pay a fee. TY.

## 2018-07-26 NOTE — Telephone Encounter (Signed)
Called and unable to get the patient on the phone 

## 2019-03-21 ENCOUNTER — Other Ambulatory Visit: Payer: Self-pay

## 2019-03-22 ENCOUNTER — Encounter: Payer: BLUE CROSS/BLUE SHIELD | Admitting: Family Medicine

## 2019-03-22 ENCOUNTER — Encounter: Payer: Self-pay | Admitting: Family Medicine

## 2019-04-18 ENCOUNTER — Telehealth: Payer: Self-pay

## 2019-04-18 ENCOUNTER — Encounter: Payer: BC Managed Care – PPO | Admitting: Family Medicine

## 2019-04-18 NOTE — Telephone Encounter (Signed)
No charge if death in family.

## 2019-04-18 NOTE — Telephone Encounter (Signed)
Copied from [place] [id number]. Topic: Appointment Scheduling - Scheduling Inquiry for Clinic >> Apr 18, 2019  7:18 AM Alanda Slim E wrote: Reason for CRM: Pt was rescheduled due to death in the family this weekend/ Pt would like to know if he will be charged and if so can office advise him of how much he will have to pay at his next OV/ please advise

## 2019-04-25 ENCOUNTER — Other Ambulatory Visit: Payer: Self-pay

## 2019-04-25 ENCOUNTER — Encounter: Payer: Self-pay | Admitting: Family Medicine

## 2019-04-25 ENCOUNTER — Ambulatory Visit (INDEPENDENT_AMBULATORY_CARE_PROVIDER_SITE_OTHER): Payer: BC Managed Care – PPO | Admitting: Family Medicine

## 2019-04-25 VITALS — BP 118/80 | HR 84 | Temp 96.9°F | Ht 72.0 in | Wt 263.4 lb

## 2019-04-25 DIAGNOSIS — K219 Gastro-esophageal reflux disease without esophagitis: Secondary | ICD-10-CM

## 2019-04-25 DIAGNOSIS — I8391 Asymptomatic varicose veins of right lower extremity: Secondary | ICD-10-CM | POA: Diagnosis not present

## 2019-04-25 DIAGNOSIS — G4733 Obstructive sleep apnea (adult) (pediatric): Secondary | ICD-10-CM | POA: Insufficient documentation

## 2019-04-25 DIAGNOSIS — Z0000 Encounter for general adult medical examination without abnormal findings: Secondary | ICD-10-CM

## 2019-04-25 DIAGNOSIS — J302 Other seasonal allergic rhinitis: Secondary | ICD-10-CM

## 2019-04-25 DIAGNOSIS — L918 Other hypertrophic disorders of the skin: Secondary | ICD-10-CM

## 2019-04-25 LAB — COMPREHENSIVE METABOLIC PANEL
ALT: 53 U/L (ref 0–53)
AST: 42 U/L — ABNORMAL HIGH (ref 0–37)
Albumin: 4.1 g/dL (ref 3.5–5.2)
Alkaline Phosphatase: 92 U/L (ref 39–117)
BUN: 14 mg/dL (ref 6–23)
CO2: 27 mEq/L (ref 19–32)
Calcium: 9.4 mg/dL (ref 8.4–10.5)
Chloride: 102 mEq/L (ref 96–112)
Creatinine, Ser: 1.05 mg/dL (ref 0.40–1.50)
GFR: 94.39 mL/min (ref >60.00)
Glucose, Bld: 81 mg/dL (ref 70–99)
Potassium: 3.9 mEq/L (ref 3.5–5.1)
Sodium: 140 mEq/L (ref 135–145)
Total Bilirubin: 0.6 mg/dL (ref 0.2–1.2)
Total Protein: 6.8 g/dL (ref 6.0–8.3)

## 2019-04-25 LAB — CBC
HCT: 49.8 % (ref 39.0–52.0)
Hemoglobin: 16.7 g/dL (ref 13.0–17.0)
MCHC: 33.6 g/dL (ref 30.0–36.0)
MCV: 90.2 fl (ref 78.0–100.0)
Platelets: 292 10*3/uL (ref 150.0–400.0)
RBC: 5.53 Mil/uL (ref 4.22–5.81)
RDW: 14.8 % (ref 11.5–15.5)
WBC: 8.3 10*3/uL (ref 4.0–10.5)

## 2019-04-25 LAB — LIPID PANEL
Cholesterol: 196 mg/dL (ref 0–200)
HDL: 60.1 mg/dL (ref >39.00)
LDL Cholesterol: 109 mg/dL — ABNORMAL HIGH (ref 0–99)
NonHDL: 136.07
Total CHOL/HDL Ratio: 3
Triglycerides: 136 mg/dL (ref 0.0–149.0)
VLDL: 27.2 mg/dL (ref 0.0–40.0)

## 2019-04-25 MED ORDER — PANTOPRAZOLE SODIUM 40 MG PO TBEC: 40.0000 mg | DELAYED_RELEASE_TABLET | Freq: Every day | ORAL | 3 refills | Status: DC

## 2019-04-25 MED ORDER — LEVOCETIRIZINE DIHYDROCHLORIDE 5 MG PO TABS: 5.0000 mg | ORAL_TABLET | Freq: Every evening | ORAL | 5 refills | Status: AC

## 2019-04-25 MED FILL — PANTOPRAZOLE SOD DR 40 MG T: 40 | 30 days supply | Qty: 30 | Fill #0

## 2019-04-25 MED FILL — LEVOCETIRIZINE 5 MG TABLET: 5 | 30 days supply | Qty: 30 | Fill #0

## 2019-04-25 NOTE — Patient Instructions (Addendum)
Take 1 tab twice daily of the pantoprazole for the first 2 weeks.   The only lifestyle changes that have data behind them are weight loss for the overweight/obese and elevating the head of the bed. Finding out which foods/positions are triggers is important.  Give [place] 2-3 business days to get the results of your labs back.   Keep the diet clean and stay active.   If you do not hear anything about your referral in the next 1-2 weeks, call our office and ask for an update.  Call your insurance company and ask if they cover "painful" and/or "irritated" skin tag removal.  Give the compression stockings a try for a few months. If no improvement or any changes, let your vascular doc know.   Claritin (loratadine), Allegra (fexofenadine), Zyrtec (cetirizine) which is also equivalent to Xyzal (levocetirizine); these are listed in order from weakest to strongest. Generic, and therefore cheaper, options are in the parentheses.   Flonase (fluticasone); nasal spray that is over the counter. 2 sprays each nostril, once daily. Aim towards the same side eye when you spray.  There are available OTC, and the generic versions, which may be cheaper, are in parentheses. Show this to a pharmacist if you have trouble finding any of these items.  Let us know if you need anything.

## 2019-04-25 NOTE — Progress Notes (Signed)
Chief Complaint  Patient presents with  . Annual Exam    Well Male Alex Parsons is here for a complete physical.   His last physical was >1 year ago.  Current diet: in general, a "healthy" diet.   Current exercise: home exercises Weight trend: has decreased since last year Daytime fatigue? No. Seat belt? Yes.    Patient has a history of reflux.  Has been having waterbrash and coughing.  Some burning and irritation after meals.  He was taking Prilosec which has not been helpful.  He has not been on anything else.  Patient has a history of seasonal allergies.  He is taking a Paediatric nurse brand of Claritin.  Less than helpful.  Having a cough also in addition to various upper respiratory symptoms like runny nose, itchy/watery eyes.  No shortness of breath or fevers.  Patient has a history of varicose veins.  He had a procedure done on the right calf area.  He is now having areas on his right foot.  There is no pain or itching.  He is not sure anything for this so far.  Health maintenance Tetanus- Yes HIV- Yes  Past Medical History:  Diagnosis Date  . Anxiety   . Depression   . GERD (gastroesophageal reflux disease)   . History of chicken pox   . OSA (obstructive sleep apnea)      Past Surgical History:  Procedure Laterality Date  . VEIN SURGERY     R Leg    Medications  Current Outpatient Medications on File Prior to Visit  Medication Sig Dispense Refill  . venlafaxine XR (EFFEXOR-XR) 37.5 MG 24 hr capsule Take 1 capsule (37.5 mg total) by mouth daily with breakfast. (Patient not taking: Reported on 04/25/2019) 30 capsule 1   Allergies No Known Allergies  Family History Family History  Problem Relation Age of Onset  . Depression Brother   . Mental retardation Brother     Review of Systems: Constitutional: no fevers or chills Eye:  no recent significant change in vision Ear/Nose/Mouth/Throat:  Ears:  no hearing loss Nose/Mouth/Throat:  no complaints of nasal  congestion, no sore throat, + intermittent runny nose Cardiovascular:  no chest pain Respiratory:  no shortness of breath Gastrointestinal:  no abdominal pain, no change in bowel habits GU:  Male: negative for dysuria, frequency, and incontinence Musculoskeletal/Extremities:  no pain of the joints Integumentary (Skin/Breast): + Painful/irritating skin tags; no abnormal skin lesions reported Neurologic:  no headaches Endocrine: No unexpected weight changes Hematologic/Lymphatic:  no night sweats  Exam BP 118/80 (BP Location: Left Arm, Patient Position: Sitting, Cuff Size: Large)   Pulse 84   Temp (!) 96.9 F (36.1 C) (Temporal)   Ht 6' (1.829 m)   Wt 263 lb 6 oz (119.5 kg)   SpO2 97%   BMI 35.72 kg/m  General:  well developed, well nourished, in no apparent distress Skin: + Various skin tags; no significant moles, warts, or growths Head:  no masses, lesions, or tenderness Eyes:  pupils equal and round, sclera anicteric without injection Ears:  canals without lesions, TMs shiny without retraction, no obvious effusion, no erythema Nose:  nares patent, septum midline, mucosa normal Throat/Pharynx:  lips and gingiva without lesion; tongue and uvula midline; non-inflamed pharynx; no exudates or postnasal drainage Neck: neck supple without adenopathy, thyromegaly, or masses Lungs:  clear to auscultation, breath sounds equal bilaterally, no respiratory distress Cardio:  regular rate and rhythm, no bruits, no LE edema Abdomen:  abdomen soft,  nontender; bowel sounds normal; no masses or organomegaly Rectal: Deferred Musculoskeletal:  symmetrical muscle groups noted without atrophy or deformity Extremities:  no clubbing, cyanosis, or edema, no deformities, no skin discoloration Neuro:  gait normal; deep tendon reflexes normal and symmetric Psych: well oriented with normal range of affect and appropriate judgment/insight  Assessment and Plan  Well adult exam - Plan: Lipid panel, CBC,  Comprehensive metabolic panel  Gastroesophageal reflux disease, unspecified whether esophagitis present - Plan: pantoprazole (PROTONIX) 40 MG tablet  OSA (obstructive sleep apnea) - Plan: Ambulatory referral to Pulmonology  Asymptomatic varicose veins of right lower extremity  Seasonal allergies - Plan: levocetirizine (XYZAL) 5 MG tablet   Well 38 y.o. male. Counseled on diet and exercise. Counseled on risks and benefits of prostate cancer screening with PSA. The patient agrees to forego screening.  Stop omeprazole and replace with pantoprazole.  Reflux precautions discussed and written down. Replace loratadine with Xyzal.  Over-the-counter options also given. For the varicose veins, compression stockings and elevation recommended.  The prescription was given today.  They are not painful so we will hold off on a referral for now.  If no improvement, we will have him see his vascular specialist. For skin tags, he will contact his insurance company to see if they removed behavioral/irritated tags.  If so, we will remove them here. Other orders as above. Follow up in 1 mo to recheck pending the above workup. The patient voiced understanding and agreement to the plan.  Jilda Roche [place], DO 04/25/19 12:00 PM

## 2019-04-26 ENCOUNTER — Other Ambulatory Visit: Payer: Self-pay | Admitting: Family Medicine

## 2019-05-10 ENCOUNTER — Telehealth: Payer: Self-pay | Admitting: Family Medicine

## 2019-05-10 ENCOUNTER — Other Ambulatory Visit: Payer: Self-pay

## 2019-05-10 ENCOUNTER — Encounter: Payer: Self-pay | Admitting: Family Medicine

## 2019-05-10 ENCOUNTER — Ambulatory Visit (INDEPENDENT_AMBULATORY_CARE_PROVIDER_SITE_OTHER): Payer: BC Managed Care – PPO | Admitting: Family Medicine

## 2019-05-10 DIAGNOSIS — F430 Acute stress reaction: Secondary | ICD-10-CM | POA: Diagnosis not present

## 2019-05-10 MED ORDER — HYDROXYZINE HCL 25 MG PO TABS: 25.0000 mg | ORAL_TABLET | Freq: Three times a day (TID) | ORAL | 0 refills | Status: DC | PRN

## 2019-05-10 MED FILL — hydrOXYzine HCL 25 MG TABS: 25 | 4 days supply | Qty: 30 | Fill #0

## 2019-05-10 NOTE — Telephone Encounter (Signed)
Sched appt to discuss. Can be virtual. Ty.

## 2019-05-10 NOTE — Progress Notes (Signed)
Chief Complaint  Patient presents with  . Anxiety    needs work note  . Depression    Subjective Alex Parsons is an 38 y.o. male who presents with anxiety/depression. Due to COVID-19 pandemic, we are interacting via web portal for an electronic face-to-face visit. I verified patient's ID using 2 identifiers. Patient agreed to proceed with visit via this method. Patient is at home, I am at office. Patient and I are present for visit.   Over the past 5 days, the patient has been experiencing increased stress and anxiety.  His neighbor's young adult son recently passed away overnight due to Covid.  He has a 71 year old father who is unsure if he will make it through the pandemic.  He is also experiencing financial concerns with his oldest daughter getting ready to graduate from high school.  The culmination of these has made it very difficult for him to concentrate and remain stress free.  He is not currently following with a counselor or psychologist.  No homicidal or suicidal ideations.  He is here for a letter for work.  He is not sure that he needs extended time but would like to see how things go over the next few days.  Past Medical History:  Diagnosis Date  . Anxiety   . Depression   . GERD (gastroesophageal reflux disease)   . History of chicken pox   . OSA (obstructive sleep apnea)     Medications Current Outpatient Medications on File Prior to Visit  Medication Sig Dispense Refill  . levocetirizine (XYZAL) 5 MG tablet Take 1 tablet (5 mg total) by mouth every evening. 30 tablet 5  . pantoprazole (PROTONIX) 40 MG tablet Take 1 tablet (40 mg total) by mouth daily. 30 tablet 3   Allergies No Known Allergies  Family History Family History  Problem Relation Age of Onset  . Depression Brother   . Mental retardation Brother      Review Of Systems Cardiovascular:  no chest pain, no palpitations Psychiatric: as noted in HPI  Exam No conversational dyspnea Age appropriate  judgment and insight Nml affect and mood  Assessment and Plan  Acute stress reaction - Plan: hydrOXYzine (ATARAX/VISTARIL) 25 MG tablet  Orders as above. Counseled on adjunctive treatment with exercise/physical activity.  Number for [facility] provided in message. F/u in next week if no better and will consider starting a SSRI. Patient voiced understanding and agreement to the plan.  [place], DO 05/10/19 4:33 PM

## 2019-05-10 NOTE — Telephone Encounter (Signed)
Scheduled today

## 2019-05-10 NOTE — Telephone Encounter (Signed)
Copied from [place] [id number]. Topic: General - Other >> May 10, 2019  9:34 AM Celene Kras wrote: Reason for CRM: Pt called stating he is needing a letter stating he is needing to be out of work due to ptsd, anxiety, and depression. Please advise.  Last OV 04/25/2019

## 2019-05-21 DIAGNOSIS — Z20828 Contact with and (suspected) exposure to other viral communicable diseases: Secondary | ICD-10-CM | POA: Diagnosis not present

## 2019-06-06 ENCOUNTER — Ambulatory Visit: Payer: BC Managed Care – PPO | Admitting: Family Medicine

## 2019-06-14 ENCOUNTER — Ambulatory Visit (INDEPENDENT_AMBULATORY_CARE_PROVIDER_SITE_OTHER): Payer: BC Managed Care – PPO | Admitting: Family Medicine

## 2019-06-14 ENCOUNTER — Encounter: Payer: Self-pay | Admitting: Family Medicine

## 2019-06-14 ENCOUNTER — Other Ambulatory Visit: Payer: Self-pay

## 2019-06-14 DIAGNOSIS — F329 Major depressive disorder, single episode, unspecified: Secondary | ICD-10-CM | POA: Diagnosis not present

## 2019-06-14 DIAGNOSIS — K219 Gastro-esophageal reflux disease without esophagitis: Secondary | ICD-10-CM | POA: Diagnosis not present

## 2019-06-14 DIAGNOSIS — J302 Other seasonal allergic rhinitis: Secondary | ICD-10-CM | POA: Diagnosis not present

## 2019-06-14 DIAGNOSIS — F32A Depression, unspecified: Secondary | ICD-10-CM

## 2019-06-14 DIAGNOSIS — F419 Anxiety disorder, unspecified: Secondary | ICD-10-CM

## 2019-06-14 NOTE — Progress Notes (Signed)
Chief Complaint  Patient presents with  . Gastroesophageal Reflux    Subjective: Patient is a 38 y.o. male here for f/u. Due to COVID-19 pandemic, we are interacting via telephone. I verified patient's ID using 2 identifiers. Patient agreed to proceed with visit via this method. Patient is at home, I am at office. Patient and I are present for visit.   Pt started on Protonix for reflux, doing much better. If he forgets to take it, his s/s's return in 2-3 d. Has not tried Pepcid before. No N/V or unintentional wt changes.   Pt started on Xyzal for allergies. Reports he is tolerating well and his s/s' are controlled. No issues.  Started on hydroxyzine for acute stress. Reports it made him tired/depressed. Feels better off of it. Stress is doing better. His mother is in town and she serves as a Orthoptist.   ROS: GI: +reflux  Past Medical History:  Diagnosis Date  . Anxiety   . Depression   . GERD (gastroesophageal reflux disease)   . History of chicken pox   . OSA (obstructive sleep apnea)     Objective: No conversational dyspnea Age appropriate judgment and insight Nml affect and mood  Assessment and Plan: Gastroesophageal reflux disease, unspecified whether esophagitis present  Seasonal allergies  Anxiety and depression  1- Cont ppi. Try to wean to H2 blocker. Reflux precautions discussed. 2- Cont Xyzal. 3- OK to hold on Hydroxyzine. Let us know if s/s's return.  Total time: 11 min Fu in 5 mo.  The patient voiced understanding and agreement to the plan.  [place], DO 06/14/19  11:57 AM

## 2019-07-22 DIAGNOSIS — Z20828 Contact with and (suspected) exposure to other viral communicable diseases: Secondary | ICD-10-CM | POA: Diagnosis not present

## 2019-11-09 DIAGNOSIS — Z03818 Encounter for observation for suspected exposure to other biological agents ruled out: Secondary | ICD-10-CM | POA: Diagnosis not present

## 2019-11-09 DIAGNOSIS — Z20828 Contact with and (suspected) exposure to other viral communicable diseases: Secondary | ICD-10-CM | POA: Diagnosis not present

## 2019-11-15 ENCOUNTER — Ambulatory Visit: Payer: BC Managed Care – PPO | Admitting: Internal Medicine

## 2019-11-15 ENCOUNTER — Encounter: Payer: Self-pay | Admitting: Family Medicine

## 2019-11-28 ENCOUNTER — Institutional Professional Consult (permissible substitution): Payer: BC Managed Care – PPO | Admitting: Internal Medicine

## 2019-11-28 ENCOUNTER — Ambulatory Visit: Payer: BC Managed Care – PPO | Admitting: Internal Medicine

## 2020-01-09 ENCOUNTER — Encounter: Payer: Self-pay | Admitting: Pulmonary Disease

## 2020-01-09 ENCOUNTER — Other Ambulatory Visit: Payer: Self-pay

## 2020-01-09 ENCOUNTER — Ambulatory Visit (INDEPENDENT_AMBULATORY_CARE_PROVIDER_SITE_OTHER): Payer: BC Managed Care – PPO | Admitting: Pulmonary Disease

## 2020-01-09 VITALS — BP 120/78 | HR 78 | Temp 98.2°F | Ht 72.0 in | Wt 251.6 lb

## 2020-01-09 DIAGNOSIS — G4733 Obstructive sleep apnea (adult) (pediatric): Secondary | ICD-10-CM | POA: Diagnosis not present

## 2020-01-09 DIAGNOSIS — F17200 Nicotine dependence, unspecified, uncomplicated: Secondary | ICD-10-CM

## 2020-01-09 NOTE — Assessment & Plan Note (Signed)
Given excessive daytime somnolence, narrow pharyngeal exam, witnessed apneas & loud snoring, obstructive sleep apnea is very likely & an overnight polysomnogram will be scheduled as a home study. The pathophysiology of obstructive sleep apnea , it's cardiovascular consequences & modes of treatment including CPAP were discused with the patient in detail & they evidenced understanding.  He clearly has moderate OSA as defined by his previous sleep study.  We will reassess nonattendance 5 years since that study.  He will likely need CPAP, he is agreeable to using a full facemask since he is a mouth breather

## 2020-01-09 NOTE — Progress Notes (Signed)
Subjective:    Patient ID: Alex Parsons, male    DOB: 08-Sep-1980, 39 y.o.   MRN: [id number]  HPI  39 year old [employer] employee presents for evaluation of sleep disordered breathing  His wife has noted loud snoring for many years, this is to the point where she is sleeping in a different bedroom.  She has noted gasping episodes in his sleep.  He catches himself snoring during sleep. Epworth sleepiness score is 6 but he reports excessive tiredness and fatigue and I suspect he is underreporting on the scale. Bedtime is around 11 PM on workdays, on weekends and can be as late as 3 AM, sleep latency is 15 to 20 minutes, he generally starts sleeping on his side but rolls over on his back during the night, with 2 pillows.  His wife has to not see him when his snoring gets worse on his back and he rolls back on his side. He reports 2-3 nocturnal awakenings including nocturia and is out of bed by 7 AM on workdays feeling tired and groggy with dryness of mouth but denies headaches. He will occasionally nap for 30 minutes, naps are refreshing.   There is no history suggestive of cataplexy, sleep paralysis or parasomnias  We reviewed sleep study from 2016 he is actually lost 23 pounds since then He smokes, a pack lasts him about 2 days, lives with his wife and daughter  Significant tests/ events reviewed  05/2015 NPSG - wt 274 lbs -TST 327 minutes, AHI 26/hour, worse during REM sleep  Chief Complaint  Patient presents with  . Consult    pt has troublesleeping, snoring, waking up gasping for air        Past Medical History:  Diagnosis Date  . Anxiety   . Depression   . GERD (gastroesophageal reflux disease)   . History of chicken pox   . OSA (obstructive sleep apnea)     Past Surgical History:  Procedure Laterality Date  . VEIN SURGERY     R Leg   Past Surgical History:  Procedure Laterality Date  . VEIN SURGERY     R Leg    No Known Allergies  Social History    Socioeconomic History  . Marital status: Married    Spouse name: Not on file  . Number of children: 1  . Years of education: Not on file  . Highest education level: Not on file  Occupational History    Employer: [employer]  Tobacco Use  . Smoking status: Current Every Day Smoker  . Smokeless tobacco: Never Used  Substance and Sexual Activity  . Alcohol use: Yes    Alcohol/week: 0.0 standard drinks  . Drug use: No  . Sexual activity: Yes    Partners: Female  Other Topics Concern  . Not on file  Social History Narrative   Regular exercise-- yes 3 times a week      Diet: skips breakfast, occ lunch, large dinner, a lot of fast food      Drinks coffee daily    Social Determinants of Health   Financial Resource Strain:   . Difficulty of Paying Living Expenses:   Food Insecurity:   . Worried About Programme researcher, broadcasting/film/video in the Last Year:   . Barista in the Last Year:   Transportation Needs:   . Freight forwarder (Medical):   Marland Kitchen Lack of Transportation (Non-Medical):   Physical Activity:   . Days of Exercise per  Week:   . Minutes of Exercise per Session:   Stress:   . Feeling of Stress :   Social Connections:   . Frequency of Communication with Friends and Family:   . Frequency of Social Gatherings with Friends and Family:   . Attends Religious Services:   . Active Member of Clubs or Organizations:   . Attends Banker Meetings:   Marland Kitchen Marital Status:   Intimate Partner Violence:   . Fear of Current or Ex-Partner:   . Emotionally Abused:   Marland Kitchen Physically Abused:   . Sexually Abused:      Family History  Problem Relation Age of Onset  . Depression Brother   . Mental retardation Brother      Review of Systems  Acid heartburn, indigestion Nasal congestion Anxiety    Objective:   Physical Exam   Gen. Pleasant, obese, in no distress, normal affect ENT - no pallor,icterus, no post nasal drip, class 2 airway, high arch palate Neck:  No JVD, no thyromegaly, no carotid bruits Lungs: no use of accessory muscles, no dullness to percussion, decreased without rales or rhonchi  Cardiovascular: Rhythm regular, heart sounds  normal, no murmurs or gallops, no peripheral edema Abdomen: soft and non-tender, no hepatosplenomegaly, BS normal. Musculoskeletal: No deformities, no cyanosis or clubbing Neuro:  alert, non focal, no tremors        Assessment & Plan:

## 2020-01-09 NOTE — Assessment & Plan Note (Signed)
Smoking cessation was emphasized 

## 2020-01-09 NOTE — Patient Instructions (Signed)
Home sleep test Based on this, we will get you started with a CPAP machine

## 2020-01-31 ENCOUNTER — Other Ambulatory Visit: Payer: Self-pay

## 2020-01-31 ENCOUNTER — Ambulatory Visit: Payer: BC Managed Care – PPO

## 2020-01-31 DIAGNOSIS — G4733 Obstructive sleep apnea (adult) (pediatric): Secondary | ICD-10-CM

## 2020-02-09 DIAGNOSIS — W503XXA Accidental bite by another person, initial encounter: Secondary | ICD-10-CM | POA: Diagnosis not present

## 2020-02-09 DIAGNOSIS — S51851A Open bite of right forearm, initial encounter: Secondary | ICD-10-CM | POA: Diagnosis not present

## 2020-02-16 ENCOUNTER — Telehealth: Payer: Self-pay | Admitting: Pulmonary Disease

## 2020-02-16 DIAGNOSIS — G4733 Obstructive sleep apnea (adult) (pediatric): Secondary | ICD-10-CM | POA: Diagnosis not present

## 2020-02-16 NOTE — Telephone Encounter (Signed)
HST showed  moderate OSA, AHI 15/hour Suggest auto CPAP 5 to 15 cm with full facemask Please let patient know and schedule office visit in 6 weeks with APP/me

## 2020-02-16 NOTE — Telephone Encounter (Signed)
Treatment options for this degree of sleep disordered breathing include weight loss and CPAP therapy. If he has no co-morbidities, autoCPAP can be tried. He should be cautioned against driving when sleepy and against medications with sedative side effects. Reported results to patient. He would like to try CPAP.

## 2020-02-21 ENCOUNTER — Other Ambulatory Visit: Payer: Self-pay | Admitting: Pulmonary Disease

## 2020-02-21 DIAGNOSIS — G4733 Obstructive sleep apnea (adult) (pediatric): Secondary | ICD-10-CM

## 2020-02-21 NOTE — Progress Notes (Unsigned)
amb  

## 2020-02-21 NOTE — Telephone Encounter (Signed)
Pt order has been placed in chart sent to dme

## 2020-02-26 ENCOUNTER — Encounter (HOSPITAL_COMMUNITY): Payer: Self-pay | Admitting: Emergency Medicine

## 2020-02-26 ENCOUNTER — Other Ambulatory Visit: Payer: Self-pay

## 2020-02-26 ENCOUNTER — Emergency Department (HOSPITAL_COMMUNITY)
Admission: EM | Admit: 2020-02-26 | Discharge: 2020-02-27 | Disposition: A | Discharge status: Home / Self Care | Payer: BC Managed Care – PPO | Attending: Emergency Medicine | Admitting: Emergency Medicine

## 2020-02-26 DIAGNOSIS — R4182 Altered mental status, unspecified: Secondary | ICD-10-CM

## 2020-02-26 DIAGNOSIS — F17220 Nicotine dependence, chewing tobacco, uncomplicated: Secondary | ICD-10-CM | POA: Insufficient documentation

## 2020-02-26 DIAGNOSIS — Z79899 Other long term (current) drug therapy: Secondary | ICD-10-CM | POA: Insufficient documentation

## 2020-02-26 DIAGNOSIS — R112 Nausea with vomiting, unspecified: Secondary | ICD-10-CM | POA: Diagnosis not present

## 2020-02-26 DIAGNOSIS — R1110 Vomiting, unspecified: Secondary | ICD-10-CM

## 2020-02-26 MED ORDER — SODIUM CHLORIDE 0.9 % IV BOLUS
1000.0000 mL | Freq: Once | INTRAVENOUS | Status: AC
  Administered 2020-02-26: 1000 mL via INTRAVENOUS

## 2020-02-26 MED ORDER — ONDANSETRON HCL 4 MG/2ML IJ SOLN
4.0000 mg | Freq: Once | INTRAMUSCULAR | Status: AC
  Administered 2020-02-26: 4 mg via INTRAVENOUS
  Filled 2020-02-26: qty 2
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
## 2020-02-27 LAB — COMPREHENSIVE METABOLIC PANEL
ALT: 49 U/L — ABNORMAL HIGH (ref 0–44)
ALT: 52 U/L — ABNORMAL HIGH (ref 0–44)
AST: 53 U/L — ABNORMAL HIGH (ref 15–41)
AST: 59 U/L — ABNORMAL HIGH (ref 15–41)
Albumin: 4.1 g/dL (ref 3.5–5.0)
Albumin: 4.3 g/dL (ref 3.5–5.0)
Alkaline Phosphatase: 81 U/L (ref 38–126)
Alkaline Phosphatase: 88 U/L (ref 38–126)
Anion gap: 13 (ref 5–15)
Anion gap: 18 — ABNORMAL HIGH (ref 5–15)
BUN: 12 mg/dL (ref 6–20)
BUN: 13 mg/dL (ref 6–20)
CO2: 23 mmol/L (ref 22–32)
CO2: 25 mmol/L (ref 22–32)
Calcium: 8.7 mg/dL — ABNORMAL LOW (ref 8.9–10.3)
Calcium: 9.2 mg/dL (ref 8.9–10.3)
Chloride: 100 mmol/L (ref 98–111)
Chloride: 102 mmol/L (ref 98–111)
Creatinine, Ser: 0.92 mg/dL (ref 0.61–1.24)
Creatinine, Ser: 1.04 mg/dL (ref 0.61–1.24)
GFR calc Af Amer: >60 mL/min (ref >60)
GFR calc Af Amer: >60 mL/min (ref >60)
GFR calc non Af Amer: >60 mL/min (ref >60)
GFR calc non Af Amer: >60 mL/min (ref >60)
Glucose, Bld: 104 mg/dL — ABNORMAL HIGH (ref 70–99)
Glucose, Bld: 108 mg/dL — ABNORMAL HIGH (ref 70–99)
Potassium: 3.1 mmol/L — ABNORMAL LOW (ref 3.5–5.1)
Potassium: 3.6 mmol/L (ref 3.5–5.1)
Sodium: 140 mmol/L (ref 135–145)
Sodium: 141 mmol/L (ref 135–145)
Total Bilirubin: 0.9 mg/dL (ref 0.3–1.2)
Total Bilirubin: 1 mg/dL (ref 0.3–1.2)
Total Protein: 7.3 g/dL (ref 6.5–8.1)
Total Protein: 7.4 g/dL (ref 6.5–8.1)

## 2020-02-27 LAB — CBC WITH DIFFERENTIAL/PLATELET
Abs Immature Granulocytes: 0.16 10*3/uL — ABNORMAL HIGH (ref 0.00–0.07)
Basophils Absolute: 0 10*3/uL (ref 0.0–0.1)
Basophils Relative: 0 %
Eosinophils Absolute: 0 10*3/uL (ref 0.0–0.5)
Eosinophils Relative: 0 %
HCT: 54.3 % — ABNORMAL HIGH (ref 39.0–52.0)
Hemoglobin: 18 g/dL — ABNORMAL HIGH (ref 13.0–17.0)
Immature Granulocytes: 1 %
Lymphocytes Relative: 6 %
Lymphs Abs: 0.7 10*3/uL (ref 0.7–4.0)
MCH: 30.6 pg (ref 26.0–34.0)
MCHC: 33.1 g/dL (ref 30.0–36.0)
MCV: 92.3 fL (ref 80.0–100.0)
Monocytes Absolute: 0.7 10*3/uL (ref 0.1–1.0)
Monocytes Relative: 6 %
Neutro Abs: 10.3 10*3/uL — ABNORMAL HIGH (ref 1.7–7.7)
Neutrophils Relative %: 87 %
Platelets: 121 10*3/uL — ABNORMAL LOW (ref 150–400)
RBC: 5.88 MIL/uL — ABNORMAL HIGH (ref 4.22–5.81)
RDW: 13.8 % (ref 11.5–15.5)
WBC: 11.9 10*3/uL — ABNORMAL HIGH (ref 4.0–10.5)
nRBC: 0 % (ref 0.0–0.2)

## 2020-02-27 LAB — RAPID URINE DRUG SCREEN, HOSP PERFORMED
Amphetamines: NOT DETECTED
Barbiturates: NOT DETECTED
Benzodiazepines: NOT DETECTED
Cocaine: POSITIVE — AB
Opiates: NOT DETECTED
Tetrahydrocannabinol: POSITIVE — AB

## 2020-02-27 LAB — SALICYLATE LEVEL: Salicylate Lvl: <7 mg/dL — ABNORMAL LOW (ref 7.0–30.0)

## 2020-02-27 LAB — ETHANOL: Alcohol, Ethyl (B): <10 mg/dL (ref <10)

## 2020-02-27 LAB — LACTIC ACID, PLASMA
Lactic Acid, Venous: 1.3 mmol/L (ref 0.5–1.9)
Lactic Acid, Venous: 1 mmol/L (ref 0.5–1.9)

## 2020-02-27 LAB — CARBOXYHEMOGLOBIN - COOX: Carboxyhemoglobin: 2.4 % — ABNORMAL HIGH (ref 0.5–1.5)

## 2020-02-27 LAB — ACETAMINOPHEN LEVEL: Acetaminophen (Tylenol), Serum: <10 ug/mL — ABNORMAL LOW (ref 10–30)

## 2020-02-27 MED ORDER — POTASSIUM CHLORIDE CRYS ER 20 MEQ PO TBCR
40.0000 meq | EXTENDED_RELEASE_TABLET | Freq: Once | ORAL | Status: AC
  Administered 2020-02-27: 40 meq via ORAL
  Filled 2020-02-27: qty 2

## 2020-02-27 NOTE — ED Notes (Signed)
Lab called, states they are able to add on APAP and salicylate levels

## 2020-02-27 NOTE — ED Provider Notes (Signed)
ED ECG REPORT   Date: 02/27/2020  Rate: 76  Rhythm: normal sinus rhythm  QRS Axis: normal  Intervals: normal  ST/T Wave abnormalities: nonspecific ST/T changes  Conduction Disutrbances:none  Narrative Interpretation:   Old EKG Reviewed: none available  I have personally reviewed the EKG tracing and agree with the computerized printout as noted.    Glynn Octave, MD 02/27/20 [phone]

## 2020-02-27 NOTE — Discharge Instructions (Addendum)
Your confusion and other symptoms today are likely related to the fact that you had cocaine in your system.  Your work-up today was reassuring.  There is 1 panel of labs that is currently pending and if there are any abnormalities with this the hospital will contact you and let you know.  Please make sure to follow-up with your regular doctor in about a week and return to the emergency department for new or worsening symptoms.

## 2020-02-28 LAB — VOLATILES,BLD-ACETONE,ETHANOL,ISOPROP,METHANOL
Acetone, blood: <0.01 g/dL (ref 0.000–0.010)
Ethanol, blood: <0.01 g/dL (ref 0.000–0.010)
Isopropanol, blood: <0.01 g/dL (ref 0.000–0.010)
Methanol, blood: <0.01 g/dL (ref 0.000–0.010)

## 2020-04-03 DIAGNOSIS — G4733 Obstructive sleep apnea (adult) (pediatric): Secondary | ICD-10-CM | POA: Diagnosis not present

## 2020-05-04 DIAGNOSIS — G4733 Obstructive sleep apnea (adult) (pediatric): Secondary | ICD-10-CM | POA: Diagnosis not present

## 2020-05-10 DIAGNOSIS — G4733 Obstructive sleep apnea (adult) (pediatric): Secondary | ICD-10-CM | POA: Diagnosis not present

## 2020-05-10 DIAGNOSIS — Z20822 Contact with and (suspected) exposure to covid-19: Secondary | ICD-10-CM | POA: Diagnosis not present

## 2020-06-03 DIAGNOSIS — G4733 Obstructive sleep apnea (adult) (pediatric): Secondary | ICD-10-CM | POA: Diagnosis not present

## 2020-06-07 ENCOUNTER — Encounter (HOSPITAL_COMMUNITY): Payer: Self-pay

## 2020-06-07 ENCOUNTER — Emergency Department (HOSPITAL_COMMUNITY)
Admission: EM | Admit: 2020-06-07 | Discharge: 2020-06-07 | Disposition: A | Discharge status: Home / Self Care | Payer: BC Managed Care – PPO | Attending: Emergency Medicine | Admitting: Emergency Medicine

## 2020-06-07 ENCOUNTER — Emergency Department (HOSPITAL_COMMUNITY): Payer: BC Managed Care – PPO

## 2020-06-07 DIAGNOSIS — R109 Unspecified abdominal pain: Secondary | ICD-10-CM | POA: Diagnosis not present

## 2020-06-07 DIAGNOSIS — R1084 Generalized abdominal pain: Secondary | ICD-10-CM

## 2020-06-07 DIAGNOSIS — F17200 Nicotine dependence, unspecified, uncomplicated: Secondary | ICD-10-CM | POA: Insufficient documentation

## 2020-06-07 DIAGNOSIS — K219 Gastro-esophageal reflux disease without esophagitis: Secondary | ICD-10-CM | POA: Diagnosis not present

## 2020-06-07 DIAGNOSIS — R1110 Vomiting, unspecified: Secondary | ICD-10-CM | POA: Diagnosis not present

## 2020-06-07 LAB — CBC WITH DIFFERENTIAL/PLATELET
Abs Immature Granulocytes: 0.04 10*3/uL (ref 0.00–0.07)
Basophils Absolute: 0 10*3/uL (ref 0.0–0.1)
Basophils Relative: 1 %
Eosinophils Absolute: 0.2 10*3/uL (ref 0.0–0.5)
Eosinophils Relative: 3 %
HCT: 48 % (ref 39.0–52.0)
Hemoglobin: 16.1 g/dL (ref 13.0–17.0)
Immature Granulocytes: 1 %
Lymphocytes Relative: 27 %
Lymphs Abs: 1.5 10*3/uL (ref 0.7–4.0)
MCH: 29.8 pg (ref 26.0–34.0)
MCHC: 33.5 g/dL (ref 30.0–36.0)
MCV: 88.9 fL (ref 80.0–100.0)
Monocytes Absolute: 0.4 10*3/uL (ref 0.1–1.0)
Monocytes Relative: 7 %
Neutro Abs: 3.4 10*3/uL (ref 1.7–7.7)
Neutrophils Relative %: 61 %
Platelets: 343 10*3/uL (ref 150–400)
RBC: 5.4 MIL/uL (ref 4.22–5.81)
RDW: 13.3 % (ref 11.5–15.5)
WBC: 5.6 10*3/uL (ref 4.0–10.5)
nRBC: 0 % (ref 0.0–0.2)

## 2020-06-07 LAB — COMPREHENSIVE METABOLIC PANEL
ALT: 36 U/L (ref 0–44)
AST: 47 U/L — ABNORMAL HIGH (ref 15–41)
Albumin: 3.6 g/dL (ref 3.5–5.0)
Alkaline Phosphatase: 78 U/L (ref 38–126)
Anion gap: 11 (ref 5–15)
BUN: 11 mg/dL (ref 6–20)
CO2: 22 mmol/L (ref 22–32)
Calcium: 8.5 mg/dL — ABNORMAL LOW (ref 8.9–10.3)
Chloride: 106 mmol/L (ref 98–111)
Creatinine, Ser: 1.1 mg/dL (ref 0.61–1.24)
GFR, Estimated: >60 mL/min (ref >60)
Glucose, Bld: 117 mg/dL — ABNORMAL HIGH (ref 70–99)
Potassium: 4.1 mmol/L (ref 3.5–5.1)
Sodium: 139 mmol/L (ref 135–145)
Total Bilirubin: 1 mg/dL (ref 0.3–1.2)
Total Protein: 7 g/dL (ref 6.5–8.1)

## 2020-06-07 MED ORDER — PANTOPRAZOLE SODIUM 40 MG IV SOLR
40.0000 mg | Freq: Once | INTRAVENOUS | Status: AC
  Administered 2020-06-07: 40 mg via INTRAVENOUS
  Filled 2020-06-07: qty 40

## 2020-06-07 MED ORDER — IOHEXOL 300 MG/ML  SOLN
100.0000 mL | Freq: Once | INTRAMUSCULAR | Status: AC | PRN
  Administered 2020-06-07: 100 mL via INTRAVENOUS

## 2020-06-07 MED ORDER — SODIUM CHLORIDE (PF) 0.9 % IJ SOLN
INTRAMUSCULAR | Status: AC
  Filled 2020-06-07: qty 50

## 2020-06-07 MED ORDER — SODIUM CHLORIDE 0.9 % IV BOLUS
1000.0000 mL | Freq: Once | INTRAVENOUS | Status: AC
  Administered 2020-06-07: 1000 mL via INTRAVENOUS

## 2020-06-07 MED ORDER — PANTOPRAZOLE SODIUM 20 MG PO TBEC: 20.0000 mg | DELAYED_RELEASE_TABLET | Freq: Two times a day (BID) | ORAL | 1 refills | Status: AC

## 2020-06-07 MED ORDER — LIDOCAINE VISCOUS HCL 2 % MT SOLN
15.0000 mL | Freq: Once | OROMUCOSAL | Status: AC
  Administered 2020-06-07: 15 mL via ORAL
  Filled 2020-06-07: qty 15

## 2020-06-07 MED ORDER — ALUM & MAG HYDROXIDE-SIMETH 200-200-20 MG/5ML PO SUSP
30.0000 mL | Freq: Once | ORAL | Status: AC
  Administered 2020-06-07: 30 mL via ORAL
  Filled 2020-06-07: qty 30

## 2020-06-07 NOTE — ED Triage Notes (Signed)
Pt arrived via walk in, c/o diffuse abd pain he describes as stabbing pain x3 days. States he vomited once last night. Denies any nausea or diarrhea. States one episode of bloody stools last night.

## 2020-06-07 NOTE — ED Provider Notes (Signed)
[facility]-EMERGENCY DEPT Provider Note   CSN: [id number] Arrival date & time: 06/07/20  1448     History Chief Complaint  Patient presents with  . Abdominal Pain    Alex Parsons is a 39 y.o. male.  HPI   Patient is a 39 year old male with a medical history as noted below.  He presents today due to 3 days of stabbing abdominal pain.  States his pain is diffuse.  Worsens with palpation.  Notes that he has been working to improve his diet recently because he will experience abdominal pain when making poor dietary choices or when eating spicy foods.  His pain started after eating jerked chicken.  He notes an episode of nausea and vomiting about 2 days ago but denies any currently.  Mild constipation.  No diarrhea.  Last night he noted an episode of 2 "small spots" of blood on his stool.  No gross hematochezia.  No recurrent episodes.  Denies fevers, chills, chest pain, shortness of breath, dysuria, hematuria, penile discharge. He drinks about 5-6 ETOH beverages per week. Smokes marijuana and uses cocaine intermittently. Last cocaine use about 3 weeks ago.     Past Medical History:  Diagnosis Date  . Anxiety   . Depression   . GERD (gastroesophageal reflux disease)   . History of chicken pox   . OSA (obstructive sleep apnea)     Patient Active Problem List   Diagnosis Date Noted  . OSA (obstructive sleep apnea) 04/25/2019  . Acrochordon 04/25/2019  . Seasonal allergies 04/25/2019  . Chronic post-traumatic headache, not intractable 04/01/2018  . Post concussion syndrome 04/01/2018  . Traumatic injury to skin or subcutaneous tissue 01/01/2018  . Perforated nasal septum 01/01/2018  . Anxiety and depression 01/14/2011  . Panic attacks 01/14/2011  . Asymptomatic varicose veins of right lower extremity 04/19/2010  . TOBACCO ABUSE 03/05/2010  . GERD 03/05/2010  . CHICKENPOX, HX OF 03/05/2010    Past Surgical History:  Procedure Laterality Date  . VEIN  SURGERY     R Leg       Family History  Problem Relation Age of Onset  . Depression Brother   . Mental retardation Brother     Social History   Tobacco Use  . Smoking status: Current Every Day Smoker  . Smokeless tobacco: Never Used  Substance Use Topics  . Alcohol use: Yes    Alcohol/week: 0.0 standard drinks  . Drug use: No    Home Medications Prior to Admission medications   Medication Sig Start Date End Date Taking? Authorizing Provider  levocetirizine (XYZAL) 5 MG tablet Take 1 tablet (5 mg total) by mouth every evening. Patient not taking: Reported on 02/26/2020 04/25/19   Sharlene Dory, DO  pantoprazole (PROTONIX) 40 MG tablet Take 1 tablet (40 mg total) by mouth daily. Patient not taking: Reported on 02/26/2020 04/25/19   Sharlene Dory, DO    Allergies    Patient has no known allergies.  Review of Systems   Review of Systems  All other systems reviewed and are negative. Ten systems reviewed and are negative for acute change, except as noted in the HPI.    Physical Exam Updated Vital Signs BP (!) 152/100 (BP Location: Left Arm) Comment: informed the nurse of b/p  Pulse 91   Temp 98 F (36.7 C) (Oral)   Resp 17   SpO2 100%   Physical Exam Vitals and nursing note reviewed.  Constitutional:      General:  He is not in acute distress.    Appearance: Normal appearance. He is not ill-appearing, toxic-appearing or diaphoretic.  HENT:     Head: Normocephalic and atraumatic.     Right Ear: External ear normal.     Left Ear: External ear normal.     Nose: Nose normal.     Mouth/Throat:     Mouth: Mucous membranes are moist.     Pharynx: Oropharynx is clear. No oropharyngeal exudate or posterior oropharyngeal erythema.  Eyes:     Extraocular Movements: Extraocular movements intact.  Cardiovascular:     Rate and Rhythm: Normal rate and regular rhythm.     Pulses: Normal pulses.     Heart sounds: Normal heart sounds. No murmur heard. No  friction rub. No gallop.   Pulmonary:     Effort: Pulmonary effort is normal. No respiratory distress.     Breath sounds: Normal breath sounds. No stridor. No wheezing, rhonchi or rales.  Abdominal:     General: Abdomen is flat.     Palpations: Abdomen is soft.     Tenderness: There is generalized abdominal tenderness.     Comments: Protuberant abdomen that is soft.  Mild diffuse tenderness noted with deep palpation.  Pain seems to be worst along the right side of the abdomen diffusely.  Genitourinary:    Comments: Patient declined rectal exam. Musculoskeletal:        General: Normal range of motion.     Cervical back: Normal range of motion and neck supple. No tenderness.  Skin:    General: Skin is warm and dry.  Neurological:     General: No focal deficit present.     Mental Status: He is alert and oriented to person, place, and time.  Psychiatric:        Mood and Affect: Mood normal.        Behavior: Behavior normal.    ED Results / Procedures / Treatments   Labs (all labs ordered are listed, but only abnormal results are displayed) Labs Reviewed  COMPREHENSIVE METABOLIC PANEL - Abnormal; Notable for the following components:      Result Value   Glucose, Bld 117 (*)    Calcium 8.5 (*)    AST 47 (*)    All other components within normal limits  CBC WITH DIFFERENTIAL/PLATELET   EKG None  Radiology CT ABDOMEN PELVIS W CONTRAST  Result Date: 06/07/2020 CLINICAL DATA:  Diffuse abdominal pain, stabbing pain for 3 days, single episode of vomiting last night, single episode of bloody stools last night, history GERD, smoker EXAM: CT ABDOMEN AND PELVIS WITH CONTRAST TECHNIQUE: Multidetector CT imaging of the abdomen and pelvis was performed using the standard protocol following bolus administration of intravenous contrast. Sagittal and coronal MPR images reconstructed from axial data set. CONTRAST:  OMNIPAQUE IOHEXOL 300 MG/ML SOLN IV. No oral contrast. COMPARISON:  None  FINDINGS: Lower chest: Lung bases clear Hepatobiliary: Multiple hepatic cysts. Largest 3.2 cm lateral segment LEFT lobe image 18 and medial segment LEFT lobe 3.2 cm image 27. Gallbladder unremarkable. Pancreas: Normal appearance Spleen: Normal appearance Adrenals/Urinary Tract: Adrenal glands, kidneys, ureters, and bladder normal appearance Stomach/Bowel: Normal appendix. Stomach and bowel loops normal appearance Vascular/Lymphatic: Vascular structures patent. Aorta normal caliber. No adenopathy. Reproductive: Unremarkable prostate gland and seminal vesicles Other: No free air or free fluid. No hernia or inflammatory process. Musculoskeletal: Unremarkable IMPRESSION: Multiple hepatic cysts. No acute intra-abdominal or intrapelvic abnormalities. Electronically Signed   By: Angelyn Punt.D.  On: 06/07/2020 11:50   Procedures Procedures (including critical care time)  Medications Ordered in ED Medications  sodium chloride 0.9 % bolus 1,000 mL (0 mLs Intravenous Stopped 06/07/20 1225)  pantoprazole (PROTONIX) injection 40 mg (40 mg Intravenous Given 06/07/20 1027)  alum & mag hydroxide-simeth (MAALOX/MYLANTA) 200-200-20 MG/5ML suspension 30 mL (30 mLs Oral Given 06/07/20 1021)    And  lidocaine (XYLOCAINE) 2 % viscous mouth solution 15 mL (15 mLs Oral Given 06/07/20 1021)  sodium chloride (PF) 0.9 % injection (  Given 06/07/20 1106)  iohexol (OMNIPAQUE) 300 MG/ML solution 100 mL (100 mLs Intravenous Contrast Given 06/07/20 1135)    ED Course  I have reviewed the triage vital signs and the nursing notes.  Pertinent labs & imaging results that were available during my care of the patient were reviewed by me and considered in my medical decision making (see chart for details).    MDM Rules/Calculators/A&P                          Patient is a 39 year old male who presents to the emergency department with abdominal pain.  Patient was also initially complaining of rectal bleeding to the triage  nurse.  Upon further discussion, patient states that he felt that there were possibly 2 small spots of blood on his stool last night.  He states that he was unsure if this was something he digested or was actually blood.  I recommended a rectal exam which patient declined.  We discussed my reasoning for this exam and he once again declined.  Basic labs obtained which were generally reassuring.  CBC with differential is benign.  CMP with mild hypocalcemia at 8.5, hyperglycemia at 117, mild elevation of AST at 47.  Otherwise reassuring.  CT scan of the abdomen showing multiple hepatic cysts but otherwise no acute intra-abdominal or intrapelvic abnormalities.  Patient given a GI cocktail as well as IV Protonix.  He notes moderate relief of his pain.  Feel the patient's symptoms might be secondary to some gastritis versus gastroduodenitis.  Notes worsening pain after eating spicy or unhealthy food.  We will discharge patient on a course of Protonix.  Recommended PCP follow-up regarding his symptoms.  Return to the ER with new or worsening symptoms.  His questions were answered and he was amicable at the time of discharge.  He was initially hypertensive but this has since resolved and his vital signs are stable and all within normal limits.  Final Clinical Impression(s) / ED Diagnoses Final diagnoses:  Generalized abdominal pain    Rx / DC Orders ED Discharge Orders         Ordered    pantoprazole (PROTONIX) 20 MG tablet  2 times daily        06/07/20 1224           Placido Sou, PA-C 06/07/20 1231    Virgina Norfolk, DO 06/07/20 1513

## 2020-06-07 NOTE — Discharge Instructions (Addendum)
I am prescribing a medication called pantoprazole.  This will help with stomach acid reduction and will hopefully help alleviate some of your stomach symptoms.  You can take this twice a day.  Take this for the next 30 days.  Please follow-up with your regular doctor regarding your symptoms.  I have also given you follow-up information for [facility] gastroenterology, below.  Feel free to give them a call for further evaluation regarding your abdominal pain.  If your symptoms worsen, you can always return to the emergency department for reevaluation.  It was a pleasure to meet you.

## 2020-07-04 DIAGNOSIS — G4733 Obstructive sleep apnea (adult) (pediatric): Secondary | ICD-10-CM | POA: Diagnosis not present

## 2020-09-01 DIAGNOSIS — F321 Major depressive disorder, single episode, moderate: Secondary | ICD-10-CM | POA: Diagnosis not present

## 2020-10-04 DIAGNOSIS — F331 Major depressive disorder, recurrent, moderate: Secondary | ICD-10-CM | POA: Diagnosis not present

## 2020-10-11 DIAGNOSIS — F331 Major depressive disorder, recurrent, moderate: Secondary | ICD-10-CM | POA: Diagnosis not present

## 2020-10-30 DIAGNOSIS — F331 Major depressive disorder, recurrent, moderate: Secondary | ICD-10-CM | POA: Diagnosis not present

## 2020-12-20 IMAGING — CT CT ABD-PELV W/ CM
3 of 5 series · 17 of 46 positions shown, 19 images · IV contrast (omnipaque)
Comparison: None

CLINICAL DATA: Diffuse abdominal pain, stabbing pain for 3 days,
single episode of vomiting last night, single episode of bloody
stools last night, history GERD, smoker

EXAM:
CT ABDOMEN AND PELVIS WITH CONTRAST
TECHNIQUE: Multidetector CT imaging of the abdomen and pelvis was performed
using the standard protocol following bolus administration of
intravenous contrast. Sagittal and coronal MPR images reconstructed
from axial data set.
CONTRAST:  100mL OMNIPAQUE IOHEXOL 300 MG/ML SOLN IV. No oral
contrast.

[Series 2: axial st · axial · 0.84mm/px · z∈[-417,-22]mm · 12 of 95 slices shown, 14 images]
[im 8/95  soft-tissue]
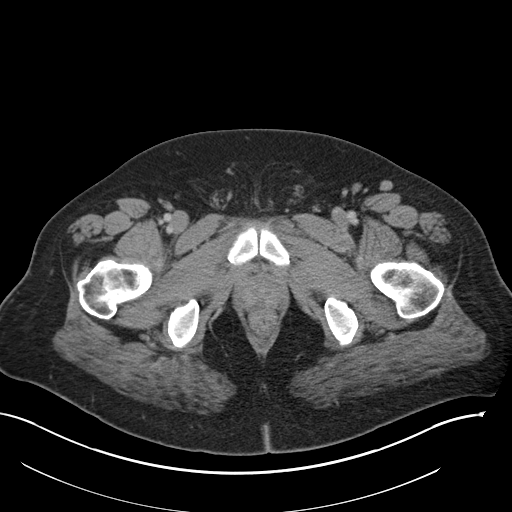
[im 8/95  bone]
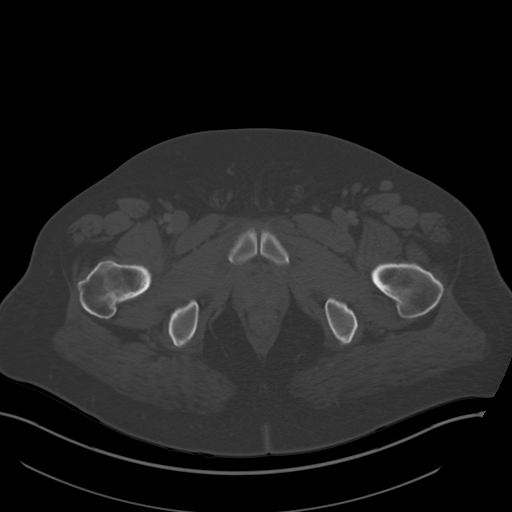
[im 15/95  soft-tissue]
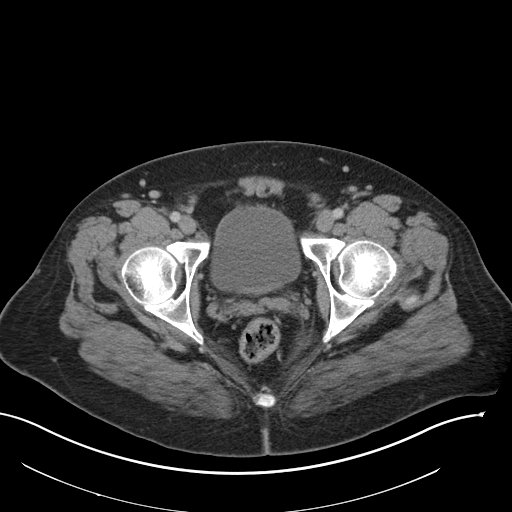
[im 22/95  soft-tissue]
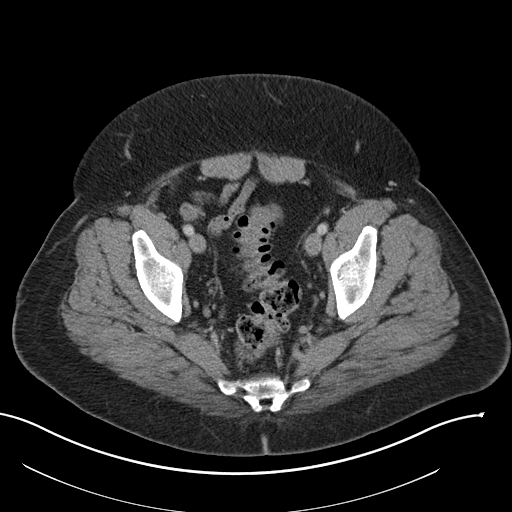
[im 29/95  soft-tissue]
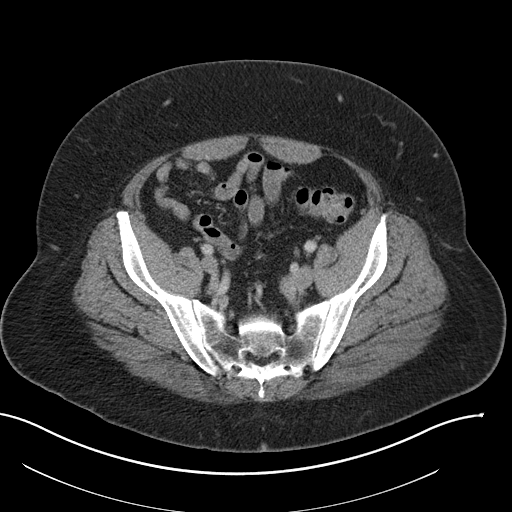
[im 37/95  soft-tissue]
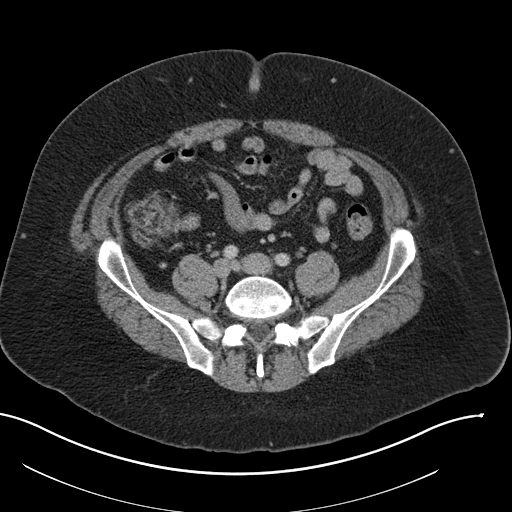
[im 44/95  soft-tissue]
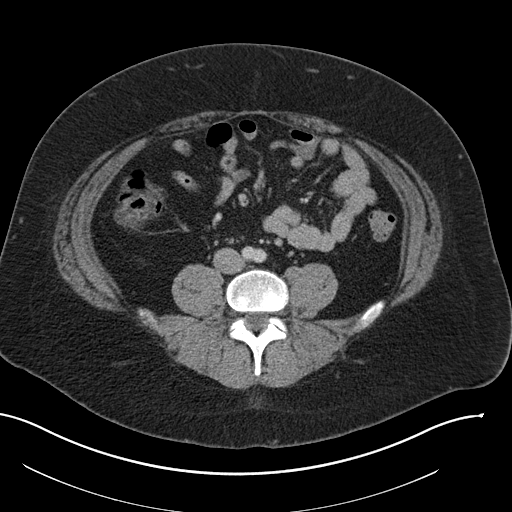
[im 51/95  soft-tissue]
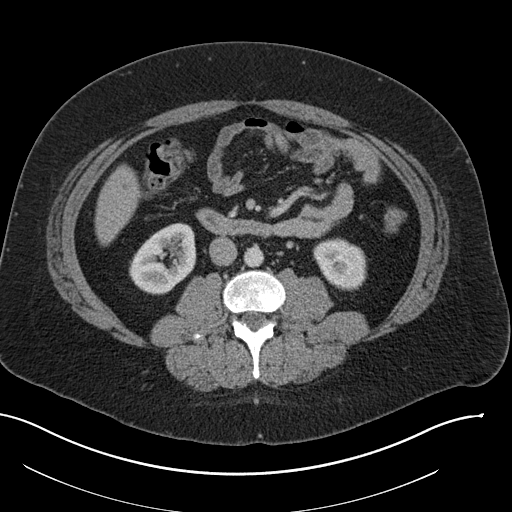
[im 58/95  soft-tissue]
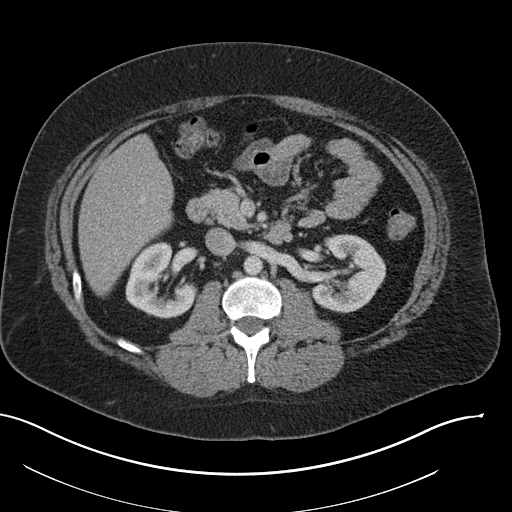
[im 66/95  soft-tissue]
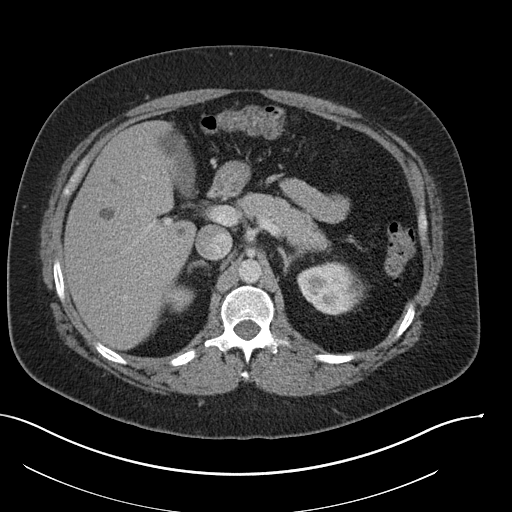
[im 66/95  bone]
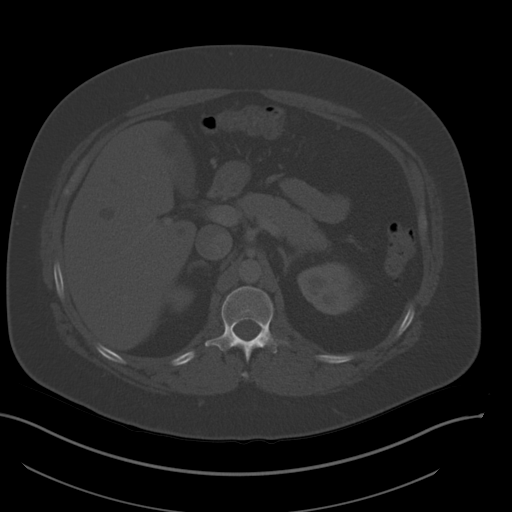
[im 73/95  soft-tissue]
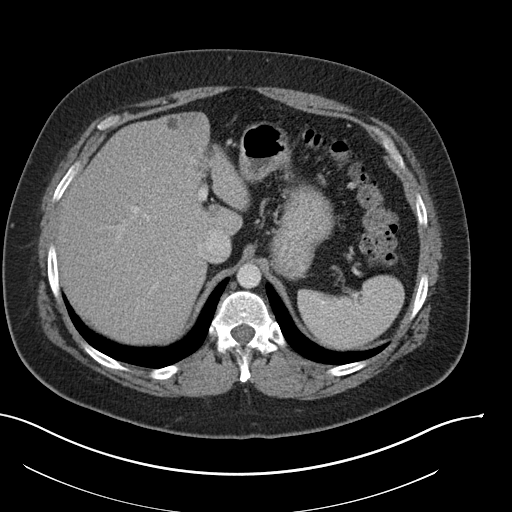
[im 80/95  soft-tissue]
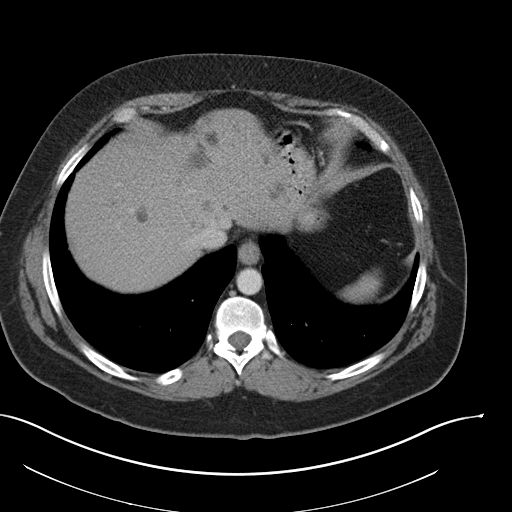
[im 87/95  soft-tissue]
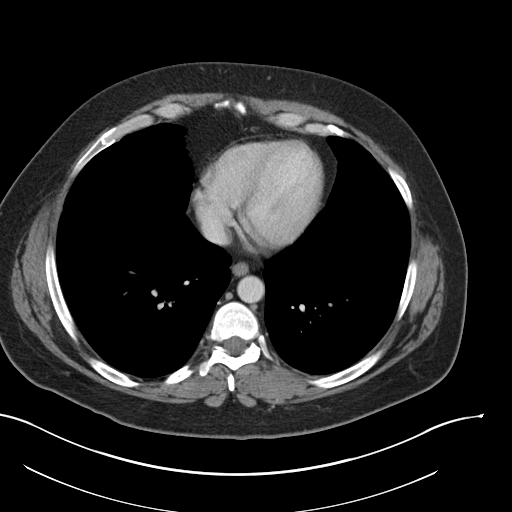

[Series 4: lung bases · axial · 0.84mm/px · z∈[-128,-102]mm · 2 of 80 slices shown]
[im 7/80  bone]
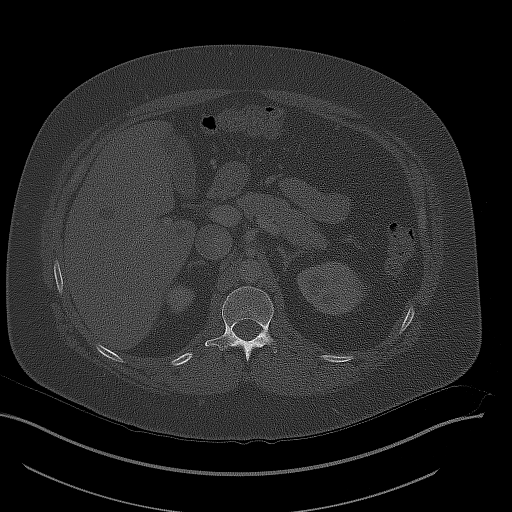
[im 20/80  bone]
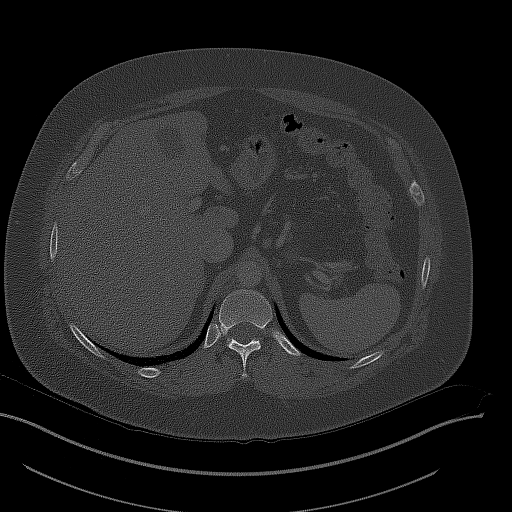

[Series 5: coronal st · coronal · 0.85mm/px · 3 of 165 slices shown]
[im 55/165  soft-tissue]
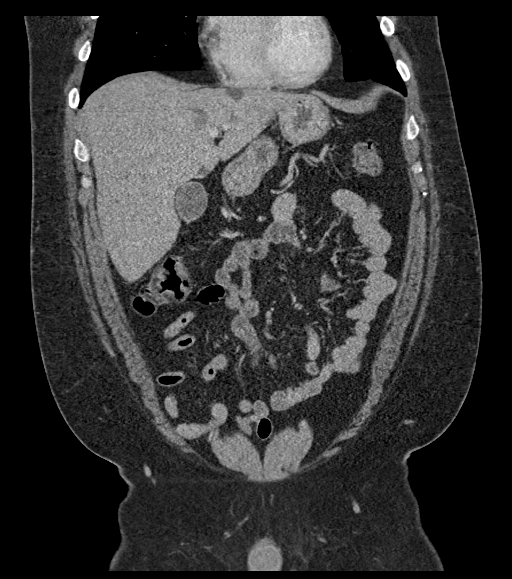
[im 73/165  soft-tissue]
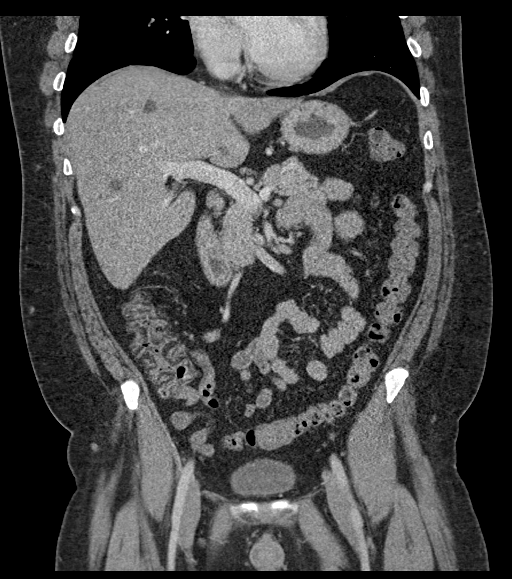
[im 92/165  soft-tissue]
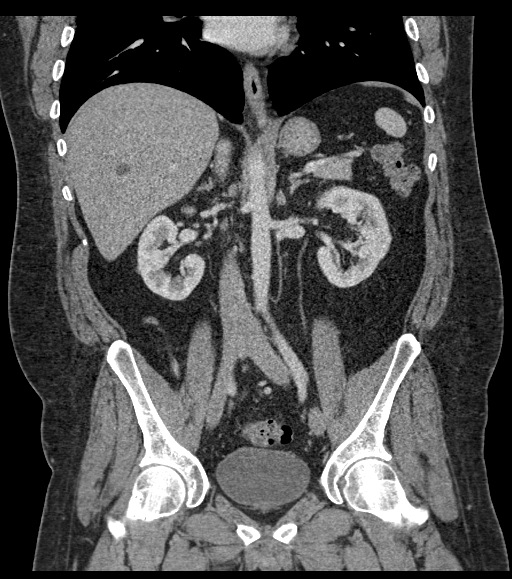

[17 of 46 positions shown; findings below may reference images not displayed]

FINDINGS: Lower chest: Lung bases clear

Hepatobiliary: Multiple hepatic cysts. Largest 3.2 cm lateral
segment LEFT lobe image 18 and medial segment LEFT lobe 3.2 cm image
27. Gallbladder unremarkable.

Pancreas: Normal appearance

Spleen: Normal appearance

Adrenals/Urinary Tract: Adrenal glands, kidneys, ureters, and
bladder normal appearance

Stomach/Bowel: Normal appendix. Stomach and bowel loops normal
appearance

Vascular/Lymphatic: Vascular structures patent. Aorta normal
caliber. No adenopathy.

Reproductive: Unremarkable prostate gland and seminal vesicles

Other: No free air or free fluid. No hernia or inflammatory process.

Musculoskeletal: Unremarkable
IMPRESSION: Multiple hepatic cysts.

No acute intra-abdominal or intrapelvic abnormalities.

## 2021-03-12 ENCOUNTER — Emergency Department (HOSPITAL_COMMUNITY)
Admission: EM | Admit: 2021-03-12 | Discharge: 2021-03-13 | Disposition: A | Discharge status: Home / Self Care | Payer: BC Managed Care – PPO | Attending: Emergency Medicine | Admitting: Emergency Medicine

## 2021-03-12 DIAGNOSIS — F17200 Nicotine dependence, unspecified, uncomplicated: Secondary | ICD-10-CM | POA: Insufficient documentation

## 2021-03-12 DIAGNOSIS — J9811 Atelectasis: Secondary | ICD-10-CM | POA: Diagnosis not present

## 2021-03-12 DIAGNOSIS — R1030 Lower abdominal pain, unspecified: Secondary | ICD-10-CM | POA: Diagnosis not present

## 2021-03-12 DIAGNOSIS — R1084 Generalized abdominal pain: Secondary | ICD-10-CM | POA: Diagnosis not present

## 2021-03-12 DIAGNOSIS — K5732 Diverticulitis of large intestine without perforation or abscess without bleeding: Secondary | ICD-10-CM | POA: Insufficient documentation

## 2021-03-12 DIAGNOSIS — K5792 Diverticulitis of intestine, part unspecified, without perforation or abscess without bleeding: Secondary | ICD-10-CM | POA: Diagnosis not present

## 2021-03-12 DIAGNOSIS — R109 Unspecified abdominal pain: Secondary | ICD-10-CM | POA: Diagnosis not present

## 2021-03-12 DIAGNOSIS — K219 Gastro-esophageal reflux disease without esophagitis: Secondary | ICD-10-CM | POA: Diagnosis not present

## 2021-03-12 DIAGNOSIS — R100 Acute abdomen: Secondary | ICD-10-CM | POA: Diagnosis not present

## 2021-03-12 LAB — URINALYSIS, ROUTINE W REFLEX MICROSCOPIC
Bilirubin Urine: NEGATIVE
Glucose, UA: NEGATIVE mg/dL
Ketones, ur: NEGATIVE mg/dL
Leukocytes,Ua: NEGATIVE
Nitrite: NEGATIVE
Protein, ur: NEGATIVE mg/dL
Specific Gravity, Urine: >1.03 — ABNORMAL HIGH (ref 1.005–1.030)
pH: 6 (ref 5.0–8.0)

## 2021-03-12 LAB — URINALYSIS, MICROSCOPIC (REFLEX): Bacteria, UA: NONE SEEN

## 2021-03-12 LAB — CBC
HCT: 53.1 % — ABNORMAL HIGH (ref 39.0–52.0)
Hemoglobin: 17.6 g/dL — ABNORMAL HIGH (ref 13.0–17.0)
MCH: 29.5 pg (ref 26.0–34.0)
MCHC: 33.1 g/dL (ref 30.0–36.0)
MCV: 89.1 fL (ref 80.0–100.0)
Platelets: 368 10*3/uL (ref 150–400)
RBC: 5.96 MIL/uL — ABNORMAL HIGH (ref 4.22–5.81)
RDW: 14.1 % (ref 11.5–15.5)
WBC: 15.3 10*3/uL — ABNORMAL HIGH (ref 4.0–10.5)
nRBC: 0 % (ref 0.0–0.2)

## 2021-03-12 LAB — COMPREHENSIVE METABOLIC PANEL
ALT: 22 U/L (ref 0–44)
AST: 20 U/L (ref 15–41)
Albumin: 4.3 g/dL (ref 3.5–5.0)
Alkaline Phosphatase: 88 U/L (ref 38–126)
Anion gap: 13 (ref 5–15)
BUN: 8 mg/dL (ref 6–20)
CO2: 25 mmol/L (ref 22–32)
Calcium: 9.2 mg/dL (ref 8.9–10.3)
Chloride: 102 mmol/L (ref 98–111)
Creatinine, Ser: 0.89 mg/dL (ref 0.61–1.24)
GFR, Estimated: >60 mL/min (ref >60)
Glucose, Bld: 85 mg/dL (ref 70–99)
Potassium: 3.7 mmol/L (ref 3.5–5.1)
Sodium: 140 mmol/L (ref 135–145)
Total Bilirubin: 0.8 mg/dL (ref 0.3–1.2)
Total Protein: 7.9 g/dL (ref 6.5–8.1)

## 2021-03-12 LAB — LIPASE, BLOOD: Lipase: 45 U/L (ref 11–51)

## 2021-03-12 NOTE — ED Triage Notes (Signed)
Pt sent in by [facility] for further eval after several days of abdominal pain and dark stool. Alert and oriented.

## 2021-03-12 NOTE — ED Triage Notes (Signed)
CAlled x 3 in lobby w/o answer

## 2021-03-13 ENCOUNTER — Other Ambulatory Visit: Payer: Self-pay

## 2021-03-13 ENCOUNTER — Encounter (HOSPITAL_COMMUNITY): Payer: Self-pay | Admitting: Student

## 2021-03-13 ENCOUNTER — Emergency Department (HOSPITAL_COMMUNITY): Payer: BC Managed Care – PPO

## 2021-03-13 DIAGNOSIS — J9811 Atelectasis: Secondary | ICD-10-CM | POA: Diagnosis not present

## 2021-03-13 DIAGNOSIS — R109 Unspecified abdominal pain: Secondary | ICD-10-CM | POA: Diagnosis not present

## 2021-03-13 DIAGNOSIS — K5792 Diverticulitis of intestine, part unspecified, without perforation or abscess without bleeding: Secondary | ICD-10-CM | POA: Diagnosis not present

## 2021-03-13 MED ORDER — SODIUM CHLORIDE 0.9 % IV BOLUS
1000.0000 mL | Freq: Once | INTRAVENOUS | Status: AC
  Administered 2021-03-13: 1000 mL via INTRAVENOUS

## 2021-03-13 MED ORDER — OXYCODONE-ACETAMINOPHEN 5-325 MG PO TABS
1.0000 | ORAL_TABLET | Freq: Once | ORAL | Status: AC
  Administered 2021-03-13: 1 via ORAL
  Filled 2021-03-13: qty 1

## 2021-03-13 MED ORDER — ONDANSETRON HCL 4 MG/2ML IJ SOLN
4.0000 mg | Freq: Once | INTRAMUSCULAR | Status: AC
  Administered 2021-03-13: 4 mg via INTRAVENOUS
  Filled 2021-03-13: qty 2

## 2021-03-13 MED ORDER — HYDROCODONE-ACETAMINOPHEN 5-325 MG PO TABS: 1.0000 | ORAL_TABLET | Freq: Four times a day (QID) | ORAL | 0 refills | Status: DC | PRN

## 2021-03-13 MED ORDER — AMOXICILLIN-POT CLAVULANATE 875-125 MG PO TABS: 1.0000 | ORAL_TABLET | Freq: Two times a day (BID) | ORAL | 0 refills | Status: DC

## 2021-03-13 MED ORDER — IOHEXOL 350 MG/ML SOLN
100.0000 mL | Freq: Once | INTRAVENOUS | Status: AC | PRN
  Administered 2021-03-13: 100 mL via INTRAVENOUS

## 2021-03-13 MED ORDER — MORPHINE SULFATE (PF) 4 MG/ML IV SOLN
4.0000 mg | Freq: Once | INTRAVENOUS | Status: AC
  Administered 2021-03-13: 4 mg via INTRAVENOUS
  Filled 2021-03-13: qty 1

## 2021-03-13 MED ORDER — ONDANSETRON 4 MG PO TBDP: 4.0000 mg | ORAL_TABLET | Freq: Three times a day (TID) | ORAL | 0 refills | Status: AC | PRN

## 2021-03-13 NOTE — ED Notes (Signed)
Discharge instructions including prescription and follow up care discussed with pt. pt verbalized understanding with no questions at this time. Pt to be picked up by daughter.

## 2021-03-13 NOTE — ED Provider Notes (Signed)
[facility]-EMERGENCY DEPT Provider Note   CSN: [id number] Arrival date & time: 03/12/21  1410     History Chief Complaint  Patient presents with   Abdominal Pain    Alex Parsons is a 40 y.o. male with a history of anxiety, depression, OSA, and GERD who presents to the emergency department with complaints of abdominal pain for the past 3 to 4 days.  Patient states pain is generalized, constant, progressively worsening, currently a 10 out of 10 in severity, associated with nausea and vomiting.  Last bowel movement was today and was somewhat dark and somewhat green.  Patient has been taking Pepto-Bismol without much relief.  He was seen at [facility] care and sent to the emergency department for further assessment as they were concerned about his appendix.  He denies fever, hematemesis, diarrhea, hematochezia, bright red blood per rectum, or dysuria.   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   Anxiety and depression 01/14/2011   Panic attacks 01/14/2011   Asymptomatic varicose veins of right lower extremity 04/19/2010   TOBACCO ABUSE 03/05/2010   GERD 03/05/2010   CHICKENPOX, HX OF 03/05/2010    Past Surgical History:  Procedure Laterality Date   VEIN SURGERY     R Leg       Family History  Problem Relation Age of Onset   Depression Brother    Mental retardation Brother     Social History   Tobacco Use   Smoking status: Every Day   Smokeless tobacco: Never   Substance Use Topics   Alcohol use: Yes    Alcohol/week: 0.0 standard drinks   Drug use: No    Home Medications Prior to Admission medications   Medication Sig Start Date End Date Taking? Authorizing Provider  levocetirizine (XYZAL) 5 MG tablet Take 1 tablet (5 mg total) by mouth every evening. Patient not taking: No sig reported 04/25/19   Sharlene Dory, DO  pantoprazole (PROTONIX) 20 MG tablet Take 1 tablet (20 mg total) by mouth 2 (two) times daily. 06/07/20   Placido Sou, PA-C    Allergies    Patient has no known allergies.  Review of Systems   Review of Systems  Constitutional:  Negative for chills and fever.  Respiratory:  Negative for shortness of breath.   Cardiovascular:  Negative for chest pain.  Gastrointestinal:  Positive for abdominal pain, nausea and vomiting. Negative for anal bleeding and diarrhea.  Genitourinary:  Negative for dysuria.  All other systems reviewed and are negative.  Physical Exam Updated Vital Signs BP 125/85 (BP Location: Right Arm)   Pulse (!) 102   Temp 99.3 F (37.4 C) (Oral)   Resp 18   Ht 6' (1.829 m)   Wt 113.4 kg   SpO2 98%   BMI 33.91 kg/m   Physical Exam Vitals and nursing note reviewed.  Constitutional:      General: He is not in acute distress.    Appearance: He  is well-developed. He is not toxic-appearing.     Comments: Initially sleeping, awoken for evaluation and alert.   HENT:     Head: Normocephalic and atraumatic.  Eyes:     General:        Right eye: No discharge.        Left eye: No discharge.     Conjunctiva/sclera: Conjunctivae normal.  Cardiovascular:     Rate and Rhythm: Regular rhythm. Tachycardia present.  Pulmonary:     Effort: Pulmonary effort is normal. No respiratory distress.     Breath sounds: Normal breath sounds. No wheezing, rhonchi or rales.  Abdominal:     General: There is no distension.     Palpations: Abdomen is soft.     Tenderness: There is generalized abdominal  tenderness (more so to LLQ). There is no guarding or rebound.  Musculoskeletal:     Cervical back: Neck supple.  Skin:    General: Skin is warm and dry.     Findings: No rash.  Neurological:     Comments: Clear speech.   Psychiatric:        Behavior: Behavior normal.    ED Results / Procedures / Treatments   Labs (all labs ordered are listed, but only abnormal results are displayed) Labs Reviewed  CBC - Abnormal; Notable for the following components:      Result Value   WBC 15.3 (*)    RBC 5.96 (*)    Hemoglobin 17.6 (*)    HCT 53.1 (*)    All other components within normal limits  URINALYSIS, ROUTINE W REFLEX MICROSCOPIC - Abnormal; Notable for the following components:   Specific Gravity, Urine >1.030 (*)    Hgb urine dipstick MODERATE (*)    All other components within normal limits  LIPASE, BLOOD  COMPREHENSIVE METABOLIC PANEL  URINALYSIS, MICROSCOPIC (REFLEX)    EKG None  Radiology DG Chest 2 View  Result Date: 03/13/2021 CLINICAL DATA:  Left flank pain. EXAM: CHEST - 2 VIEW COMPARISON:  CT from earlier today FINDINGS: Normal heart size. No pleural effusion or edema. The lung volumes are low. Subsegmental atelectasis is identified within the left lower lobe as seen on the CT from earlier today. Right lung clear. Osseous structures are unremarkable. IMPRESSION: Left lower lobe subsegmental atelectasis. Electronically Signed   By: Signa Kell M.D.   On: 03/13/2021 06:29   CT Abdomen Pelvis W Contrast  Result Date: 03/13/2021 CLINICAL DATA:  40 year old male with lower abdominal pain for 3 days. Leukocytosis. EXAM: CT ABDOMEN AND PELVIS WITH CONTRAST TECHNIQUE: Multidetector CT imaging of the abdomen and pelvis was performed using the standard protocol following bolus administration of intravenous contrast. CONTRAST:  OMNIPAQUE IOHEXOL 350 MG/ML SOLN COMPARISON:  CT Abdomen and Pelvis 06/07/2020. FINDINGS: Lower chest: Confluent peribronchial and peripheral  airspace opacity at the left lung base is associated with increased elevation of the left hemidiaphragm. This appears indeterminate for infection versus atelectasis. No pleural effusion. Heart size remains normal. Negative right lung base. Hepatobiliary: Numerous generally small chronic hepatic cysts appear stable since last year, the largest with simple fluid density as before. Otherwise negative liver and gallbladder. Pancreas: Mild secondary inflammation at the tail of the pancreas. But otherwise normal. Spleen: Secondary inflammation along the inferior pole of the spleen, but otherwise normal. Adrenals/Urinary Tract: Secondary inflammation along the left lateral pararenal space. But the left adrenal gland and kidney remain within normal limits. Symmetric renal enhancement and contrast excretion. Normal right adrenal and kidney. No  nephrolithiasis. Negative bladder. Stomach/Bowel: Negative rectosigmoid colon aside from retained stool. Short segment severe inflammation of the proximal descending colon just distal to the splenic flexure with several regional diverticula (series 2, image 29 and series 5, image 102. Mesenteric stranding and wall thickening along a segment of about 4 cm. No extraluminal gas. Stranding and/or trace free fluid layering in the left gutter. Negative upstream transverse colon, right colon and appendix. Negative terminal ileum. No dilated small bowel. Decompressed stomach. Negative duodenum. No free air. Vascular/Lymphatic: Major arterial structures in the abdomen and pelvis are patent with minimal atherosclerosis identified. Portal venous system appears to be patent. No lymphadenopathy. Reproductive: Negative. Other: No pelvic free fluid. Musculoskeletal: No acute osseous abnormality identified. IMPRESSION: 1. Short 4 cm segment of severely inflamed proximal descending colon just distal to the splenic flexure. Diverticula in the region and this appears most compatible with Acute  Diverticulitis. Mild secondary inflammation of the adjacent spleen and pancreatic tail. But no perforation, abscess, or other complicating features. 2. Left lung base opacity with elevated left hemidiaphragm may be atelectasis secondary to #1. But developing left lower lobe pneumonia is difficult to exclude. There is no pleural effusion. 3. No other acute or inflammatory process identified in the abdomen or pelvis. Electronically Signed   By: Odessa Fleming M.D.   On: 03/13/2021 04:37    Procedures Procedures   Medications Ordered in ED Medications  sodium chloride 0.9 % bolus 1,000 mL (has no administration in time range)  ondansetron (ZOFRAN) injection 4 mg (has no administration in time range)  morphine 4 MG/ML injection 4 mg (has no administration in time range)    ED Course  I have reviewed the triage vital signs and the nursing notes.  Pertinent labs & imaging results that were available during my care of the patient were reviewed by me and considered in my medical decision making (see chart for details).    MDM Rules/Calculators/A&P                           Patient presents to the ED with complaints of abdominal pain. Patient nontoxic appearing, in no apparent distress, vitals with mild tachycardia.   On exam patient tender to the generalized abdomen, no peritoneal signs. Will evaluate with CT abdomen/pelvis with contrast, labs per triage as below. Analgesics, anti-emetics, and fluids administered.   Additional history obtained:  Additional history obtained from chart review & nursing note review.   Lab Tests:  I Ordered, reviewed, and interpreted labs, which included:  CBC: Leukocytosis with elevated hemoglobin and hematocrit CMP: Unremarkable Lipase: Within normal limits UA: Elevated specific gravity and hematuria  Imaging Studies ordered:  I ordered imaging studies which included CT A/P, I independently reviewed, formal radiology impression shows: 1. Short 4 cm segment of  severely inflamed proximal descending colon just distal to the splenic flexure. Diverticula in the region and this appears most compatible with Acute Diverticulitis. Mild secondary inflammation of the adjacent spleen and pancreatic tail. But no perforation, abscess, or other complicating features. 2. Left lung base opacity with elevated left hemidiaphragm may be atelectasis secondary to #1. But developing left lower lobe pneumonia is difficult to exclude. There is no pleural effusion. 3. No other acute or inflammatory process identified in the abdomen or pelvis  CXR:  Left lower lobe subsegmental atelectasis  ED Course:  06:35: RE-EVAL: Patient resting comfortably, woken from sleep, states he still has some mild discomfort, will give oral pain  medication, however he is tolerating p.o., repeat abdominal exam remains without peritoneal signs, he overall appears appropriate for discharge home.   Will discharge home on Augmentin & zofran + norco for supportive care. Clear liquid diet with slow advancement & GI follow up. I discussed results, treatment plan, need for follow-up, and return precautions with the patient. Provided opportunity for questions, patient confirmed understanding and is in agreement with plan.   Portions of this note were generated with Scientist, clinical (histocompatibility and immunogenetics). Dictation errors may occur despite best attempts at proofreading.  Final Clinical Impression(s) / ED Diagnoses Final diagnoses:  Diverticulitis    Rx / DC Orders ED Discharge Orders     None        Cherly Anderson, PA-C 03/13/21 5643    Palumbo, April, MD 03/13/21 [phone]

## 2021-03-13 NOTE — ED Notes (Signed)
Patient transported to CT 

## 2021-03-13 NOTE — Discharge Instructions (Addendum)
You were seen in the emergency department today for abdominal pain.  Your CT scan showed diverticulitis.  Your blood work showed that your white blood cell count was elevated which is likely from the diverticulitis.  Your urine had some blood in it, have this rechecked by your primary care provider.  We are sending you with Augmentin, this is an antibiotic to take for the diverticulitis infection, please take this as prescribed.  We are also sending you in Zofran to take every 8 hours as needed for nausea and vomiting.    We have prescribed you new medication(s) today. Discuss the medications prescribed today with your pharmacist as they can have adverse effects and interactions with your other medicines including over the counter and prescribed medications. Seek medical evaluation if you start to experience new or abnormal symptoms after taking one of these medicines, seek care immediately if you start to experience difficulty breathing, feeling of your throat closing, facial swelling, or rash as these could be indications of a more serious allergic reaction  Please follow a clear liquid diet for the next 48 hours and then slowly advance your diet. Take probiotics. Please follow-up with your primary care provider soon as possible. Please also follow up with GI.  Return to the emergency department for new or worsening symptoms including but not limited to new or worsening pain, fever, inability to keep fluids down, blood in your vomit or stool, or any other concerns.

## 2021-03-13 NOTE — ED Notes (Signed)
Patient transported to X-ray 

## 2021-04-19 DIAGNOSIS — K5792 Diverticulitis of intestine, part unspecified, without perforation or abscess without bleeding: Secondary | ICD-10-CM | POA: Diagnosis not present

## 2021-05-02 DIAGNOSIS — R051 Acute cough: Secondary | ICD-10-CM | POA: Diagnosis not present

## 2021-05-02 DIAGNOSIS — Z03818 Encounter for observation for suspected exposure to other biological agents ruled out: Secondary | ICD-10-CM | POA: Diagnosis not present

## 2021-05-02 DIAGNOSIS — B338 Other specified viral diseases: Secondary | ICD-10-CM | POA: Diagnosis not present

## 2021-05-02 DIAGNOSIS — R509 Fever, unspecified: Secondary | ICD-10-CM | POA: Diagnosis not present

## 2022-01-19 ENCOUNTER — Other Ambulatory Visit: Payer: Self-pay

## 2022-01-19 ENCOUNTER — Emergency Department (HOSPITAL_BASED_OUTPATIENT_CLINIC_OR_DEPARTMENT_OTHER)
Admission: EM | Admit: 2022-01-19 | Discharge: 2022-01-19 | Disposition: A | Discharge status: Home / Self Care | Payer: BC Managed Care – PPO | Attending: Emergency Medicine | Admitting: Emergency Medicine

## 2022-01-19 DIAGNOSIS — W010XXA Fall on same level from slipping, tripping and stumbling without subsequent striking against object, initial encounter: Secondary | ICD-10-CM | POA: Insufficient documentation

## 2022-01-19 DIAGNOSIS — G8911 Acute pain due to trauma: Secondary | ICD-10-CM | POA: Insufficient documentation

## 2022-01-19 DIAGNOSIS — M62838 Other muscle spasm: Secondary | ICD-10-CM | POA: Insufficient documentation

## 2022-01-19 DIAGNOSIS — M545 Low back pain, unspecified: Secondary | ICD-10-CM | POA: Insufficient documentation

## 2022-01-19 MED ORDER — IBUPROFEN 800 MG PO TABS
800.0000 mg | ORAL_TABLET | Freq: Once | ORAL | Status: AC
  Administered 2022-01-19: 800 mg via ORAL
  Filled 2022-01-19: qty 1

## 2022-01-19 MED ORDER — METHOCARBAMOL 500 MG PO TABS: 500.0000 mg | ORAL_TABLET | Freq: Two times a day (BID) | ORAL | 0 refills | Status: AC

## 2022-01-19 MED ORDER — DEXAMETHASONE SODIUM PHOSPHATE 10 MG/ML IJ SOLN
10.0000 mg | Freq: Once | INTRAMUSCULAR | Status: AC
  Administered 2022-01-19: 10 mg via INTRAMUSCULAR
  Filled 2022-01-19: qty 1

## 2022-01-19 MED ORDER — OXYCODONE-ACETAMINOPHEN 5-325 MG PO TABS
1.0000 | ORAL_TABLET | ORAL | Status: DC | PRN
  Administered 2022-01-19: 1 via ORAL
  Filled 2022-01-19: qty 1

## 2022-01-19 NOTE — ED Provider Notes (Signed)
[facility] EMERGENCY DEPT Provider Note   CSN: [id number] Arrival date & time: 01/19/22  1103     History  Chief Complaint  Patient presents with   Back Pain    Alex Parsons is a 41 y.o. male.  With past medical history of GERD who presents to the emergency department with back pain.  Patient states that he was getting out of his car and walking in his driveway yesterday evening when he slipped, twisting and fell.  He states immediately he started having right-sided lower back pain.  He states that he did up taking some ibuprofen last night which did not really help.  States that he was getting ready for bed and coughed and felt like he had significantly worsening pain in his right lower back.  He states that the pain does not radiate into his legs.  He has no numbness or tingling of the legs no weakness in the legs, no saddle anesthesia, no incontinence/retention of bowel or bladder.  No previous back surgeries.   Back Pain      Home Medications Prior to Admission medications   Medication Sig Start Date End Date Taking? Authorizing Provider  amoxicillin-clavulanate (AUGMENTIN) 875-125 MG tablet Take 1 tablet by mouth every 12 (twelve) hours. 03/13/21   Petrucelli, Pleas Koch, PA-C  HYDROcodone-acetaminophen (NORCO/VICODIN) 5-325 MG tablet Take 1-2 tablets by mouth every 6 (six) hours as needed. 03/13/21   Petrucelli, Lelon Mast R, PA-C  levocetirizine (XYZAL) 5 MG tablet Take 1 tablet (5 mg total) by mouth every evening. Patient not taking: No sig reported 04/25/19   Sharlene Dory, DO  ondansetron (ZOFRAN ODT) 4 MG disintegrating tablet Take 1 tablet (4 mg total) by mouth every 8 (eight) hours as needed for nausea or vomiting. 03/13/21   Petrucelli, Samantha R, PA-C  pantoprazole (PROTONIX) 20 MG tablet Take 1 tablet (20 mg total) by mouth 2 (two) times daily. 06/07/20   Placido Sou, PA-C      Allergies    Patient has no known allergies.    Review of  Systems   Review of Systems  Musculoskeletal:  Positive for back pain and myalgias.  All other systems reviewed and are negative.   Physical Exam Updated Vital Signs BP 128/89 (BP Location: Left Arm)   Pulse 95   Temp 98.4 F (36.9 C)   Resp 16   Ht 6' (1.829 m)   Wt 106.1 kg   SpO2 98%   BMI 31.74 kg/m  Physical Exam Vitals and nursing note reviewed.  Constitutional:      General: He is not in acute distress.    Appearance: Normal appearance. He is not ill-appearing.  HENT:     Head: Normocephalic and atraumatic.  Eyes:     General: No scleral icterus.    Extraocular Movements: Extraocular movements intact.  Cardiovascular:     Pulses: Normal pulses.  Pulmonary:     Effort: Pulmonary effort is normal. No respiratory distress.  Abdominal:     Palpations: Abdomen is soft.     Tenderness: There is no right CVA tenderness or left CVA tenderness.  Musculoskeletal:        General: Tenderness present. No deformity.     Cervical back: Neck supple. No tenderness or bony tenderness.     Thoracic back: Spasms present. No bony tenderness.     Lumbar back: No bony tenderness. Negative right straight leg raise test and negative left straight leg raise test.  Back:     Comments: Tenderness to palpation of the right paraspinal muscles.  No bony tenderness.  No edema, bruising, step-offs or deformities.  Skin:    General: Skin is warm and dry.     Capillary Refill: Capillary refill takes less than 2 seconds.     Findings: No bruising or rash.  Neurological:     General: No focal deficit present.     Mental Status: He is alert and oriented to person, place, and time. Mental status is at baseline.     Sensory: No sensory deficit.     Motor: No weakness.  Psychiatric:        Mood and Affect: Mood normal.        Behavior: Behavior normal.        Thought Content: Thought content normal.        Judgment: Judgment normal.    ED Results / Procedures / Treatments   Labs (all  labs ordered are listed, but only abnormal results are displayed) Labs Reviewed - No data to display  EKG None  Radiology No results found.  Procedures Procedures    Medications Ordered in ED Medications  oxyCODONE-acetaminophen (PERCOCET/ROXICET) 5-325 MG per tablet 1 tablet (1 tablet Oral Given 01/19/22 1128)  dexamethasone (DECADRON) injection 10 mg (10 mg Intramuscular Given 01/19/22 1327)  ibuprofen (ADVIL) tablet 800 mg (800 mg Oral Given 01/19/22 1330)    ED Course/ Medical Decision Making/ A&P                           Medical Decision Making Risk Prescription drug management.  This patient presents to the ED for concern of back pain, this involves an extensive number of treatment options, and is a complaint that carries with it a high risk of complications and morbidity.  The differential diagnosis includes fracture, subluxation, musculoskeletal injury, epidural abscess, cauda equina,  etc.  Co morbidities that complicate the patient evaluation None  Additional history obtained:  Additional history obtained from: None External records from outside source obtained and reviewed including: None  Medications  I ordered medication including Percocet, ibuprofen, Decadron for pain and inflammation Reevaluation of the patient after medication shows that patient improved -I reviewed the patient's home medications and did not make adjustments. -I did  prescribe new home medications.  Tests Considered: Considered plain films of the low back, however there is no bony tenderness, step-offs or deformities.  Critical Interventions: Pain control  Consultations: N/A  SDH None identified  ED Course:  41 year old male who presents emergency department after slip and fall outside.  Now complaining of right-sided low back pain. No radiculopathy symptoms.  Compartments soft.  No step-offs or deformities.  No bony tenderness. Deferring imaging at this time because there is no  midline tenderness concerning for bony injury. No cauda equina symptoms No IV drug use or fever concerning for epidural abscess  All of his pain is paraspinal in the muscular space.  I do not acutely feel any spasms but may be the cause of most of his symptoms.  No associated sciatica.  Given pain control and steroids here in the emergency department.  We will send him home with some Robaxin.  Given return precautions for any cauda equina symptoms or inability to ambulate.  He verbalized understanding.  After consideration of the diagnostic results and the patients response to treatment, I feel that the patent would benefit from discharge. The patient has been appropriately medically  screened and/or stabilized in the ED. I have low suspicion for any other emergent medical condition which would require further screening, evaluation or treatment in the ED or require inpatient management. The patient is overall well appearing and non-toxic in appearance. They are hemodynamically stable at time of discharge.   Final Clinical Impression(s) / ED Diagnoses Final diagnoses:  Acute right-sided low back pain without sciatica    Rx / DC Orders ED Discharge Orders          Ordered    methocarbamol (ROBAXIN) 500 MG tablet  2 times daily        01/19/22 1344              Cristopher Peru, PA-C 01/19/22 1350    Tegeler, Canary Brim, MD 01/19/22 [phone]

## 2022-01-19 NOTE — ED Triage Notes (Signed)
Patient arrives with complaints of right lower back pain after slipping yesterday and turning too quickly. Rates pain a 20/10.

## 2022-01-19 NOTE — Discharge Instructions (Signed)
You were seen in the emergency department for right-sided back pain.  This is likely muscular spasm after your fall.  I am providing you with Robaxin.  Please return to emergency department for inability to walk, numbness or tingling down your legs or groin.  Otherwise please follow-up with a primary care provider

## 2022-01-22 ENCOUNTER — Encounter (HOSPITAL_BASED_OUTPATIENT_CLINIC_OR_DEPARTMENT_OTHER): Payer: Self-pay | Admitting: Emergency Medicine

## 2022-01-22 ENCOUNTER — Other Ambulatory Visit (HOSPITAL_BASED_OUTPATIENT_CLINIC_OR_DEPARTMENT_OTHER): Payer: Self-pay

## 2022-01-22 ENCOUNTER — Emergency Department (HOSPITAL_BASED_OUTPATIENT_CLINIC_OR_DEPARTMENT_OTHER): Payer: Self-pay | Admitting: Radiology

## 2022-01-22 ENCOUNTER — Other Ambulatory Visit: Payer: Self-pay

## 2022-01-22 ENCOUNTER — Emergency Department (HOSPITAL_BASED_OUTPATIENT_CLINIC_OR_DEPARTMENT_OTHER)
Admission: EM | Admit: 2022-01-22 | Discharge: 2022-01-22 | Disposition: A | Discharge status: Home / Self Care | Payer: Self-pay | Attending: Emergency Medicine | Admitting: Emergency Medicine

## 2022-01-22 DIAGNOSIS — S39012A Strain of muscle, fascia and tendon of lower back, initial encounter: Secondary | ICD-10-CM | POA: Insufficient documentation

## 2022-01-22 DIAGNOSIS — W010XXA Fall on same level from slipping, tripping and stumbling without subsequent striking against object, initial encounter: Secondary | ICD-10-CM | POA: Insufficient documentation

## 2022-01-22 DIAGNOSIS — R0981 Nasal congestion: Secondary | ICD-10-CM | POA: Insufficient documentation

## 2022-01-22 DIAGNOSIS — W19XXXD Unspecified fall, subsequent encounter: Secondary | ICD-10-CM

## 2022-01-22 DIAGNOSIS — S39012D Strain of muscle, fascia and tendon of lower back, subsequent encounter: Secondary | ICD-10-CM

## 2022-01-22 DIAGNOSIS — M545 Low back pain, unspecified: Secondary | ICD-10-CM

## 2022-01-22 MED ORDER — METHYLPREDNISOLONE 4 MG PO TBPK
ORAL_TABLET | ORAL | 0 refills | Status: AC
  Filled 2022-01-22: qty 21, 6d supply, fill #0

## 2022-01-22 MED ORDER — HYDROCODONE-ACETAMINOPHEN 5-325 MG PO TABS
2.0000 | ORAL_TABLET | ORAL | 0 refills | Status: AC | PRN
  Filled 2022-01-22: qty 10, 1d supply, fill #0

## 2022-01-22 NOTE — Discharge Instructions (Addendum)
You were seen in the emergency department for ongoing back pain.  As we discussed, the x-rays of your back and ribs did not show any broken bones or dislocations.   I think you likely pulled a muscle in your lower back. Spasms and cramps are very common as these muscles are trying to heal. I'm prescribing you some stronger pain medication you can take every 6 hours as needed for severe pain. In between, you can take 800 mg of ibuprofen.   Use a heating pad or topical creams like IcyHot or Biofreeze over the area for some extra relief.  As tolerated, start gently stretching your lower back. I've attached some stretches you can do as you feel ready.  Continue to monitor how you're doing and return to the ER for new or worsening symptoms.

## 2022-01-22 NOTE — ED Triage Notes (Addendum)
Pt arrives to ED with c/o thoracic back pain and posterior ribcage pain. Pt reports he slipped, twisted, and fell on Sunday morning. Was seen for same on 7/30.

## 2022-01-22 NOTE — ED Provider Notes (Signed)
[facility] EMERGENCY DEPT Provider Note   CSN: [id number] Arrival date & time: 01/22/22  1104     History  Chief Complaint  Patient presents with   Back Pain   Rib Injury    Alex Parsons is a 41 y.o. male who presents to the emergency department complaining of lower back and ribcage pain. Was seen on 7/30 for same after he slipped and fell. Has been having continued muscle spasms in right lower back. Has been taking prescribed muscle relaxer and 800 mg of ibuprofen every 4 hours.  Denies any pain running down his legs or numbness.  Denies any urinary retention, urine or bowel incontinence.   Back Pain Associated symptoms: no numbness and no weakness        Home Medications Prior to Admission medications   Medication Sig Start Date End Date Taking? Authorizing Provider  HYDROcodone-acetaminophen (NORCO/VICODIN) 5-325 MG tablet Take 2 tablets by mouth every 4 (four) hours as needed. 01/22/22  Yes Marrion Finan T, PA-C  methylPREDNISolone (MEDROL DOSEPAK) 4 MG TBPK tablet Take per package instructions 01/22/22  Yes Jiro Kiester T, PA-C  amoxicillin-clavulanate (AUGMENTIN) 875-125 MG tablet Take 1 tablet by mouth every 12 (twelve) hours. 03/13/21   Petrucelli, Lelon Mast R, PA-C  levocetirizine (XYZAL) 5 MG tablet Take 1 tablet (5 mg total) by mouth every evening. Patient not taking: No sig reported 04/25/19   Sharlene Dory, DO  methocarbamol (ROBAXIN) 500 MG tablet Take 1 tablet (500 mg total) by mouth 2 (two) times daily. 01/19/22   Cristopher Peru, PA-C  ondansetron (ZOFRAN ODT) 4 MG disintegrating tablet Take 1 tablet (4 mg total) by mouth every 8 (eight) hours as needed for nausea or vomiting. 03/13/21   Petrucelli, Samantha R, PA-C  pantoprazole (PROTONIX) 20 MG tablet Take 1 tablet (20 mg total) by mouth 2 (two) times daily. 06/07/20   Placido Sou, PA-C      Allergies    Patient has no known allergies.    Review of Systems   Review of Systems   Genitourinary:  Negative for decreased urine volume and difficulty urinating.  Musculoskeletal:  Positive for back pain and myalgias.  Neurological:  Negative for weakness and numbness.  All other systems reviewed and are negative.   Physical Exam Updated Vital Signs BP (!) 135/98 (BP Location: Right Arm)   Pulse 67   Temp 97.9 F (36.6 C) (Oral)   Resp 19   SpO2 100%  Physical Exam Vitals and nursing note reviewed.  Constitutional:      Appearance: Normal appearance.     Comments: Standing in exam room  HENT:     Head: Normocephalic and atraumatic.  Eyes:     Conjunctiva/sclera: Conjunctivae normal.  Pulmonary:     Effort: Pulmonary effort is normal. No respiratory distress.  Musculoskeletal:     Comments: No midline spinal tenderness, step-offs or crepitus.  Lumbar muscular tenderness to palpation, worse on the right compared to the left.  With palpation elicited muscle cramp.  Skin:    General: Skin is warm and dry.  Neurological:     Mental Status: He is alert.  Psychiatric:        Mood and Affect: Mood normal.        Behavior: Behavior normal.     ED Results / Procedures / Treatments   Labs (all labs ordered are listed, but only abnormal results are displayed) Labs Reviewed - No data to display  EKG None  Radiology DG Thoracic  Spine 2 View  Result Date: 01/22/2022 CLINICAL DATA:  Back and posterior ribcage pain after falling. EXAM: THORACIC SPINE 2 VIEWS; RIGHT RIBS AND CHEST - 3+ VIEW COMPARISON:  Chest radiographs 03/13/2021. FINDINGS: The heart size and mediastinal contours are stable. The lungs are clear. There is no pleural effusion or pneumothorax. No rib fractures or focal rib lesions are identified. There are 12 rib-bearing thoracic type vertebral bodies. The alignment is normal. The disc spaces are relatively preserved, and there is no evidence of acute fracture, paraspinal hematoma or widening of the interpedicular distance. IMPRESSION: 1. No evidence  of acute right-sided rib fracture or acute thoracic spine injury. 2. No active cardiopulmonary process. Electronically Signed   By: Richardean Sale M.D.   On: 01/22/2022 12:12   DG Ribs Unilateral W/Chest Right  Result Date: 01/22/2022 CLINICAL DATA:  Back and posterior ribcage pain after falling. EXAM: THORACIC SPINE 2 VIEWS; RIGHT RIBS AND CHEST - 3+ VIEW COMPARISON:  Chest radiographs 03/13/2021. FINDINGS: The heart size and mediastinal contours are stable. The lungs are clear. There is no pleural effusion or pneumothorax. No rib fractures or focal rib lesions are identified. There are 12 rib-bearing thoracic type vertebral bodies. The alignment is normal. The disc spaces are relatively preserved, and there is no evidence of acute fracture, paraspinal hematoma or widening of the interpedicular distance. IMPRESSION: 1. No evidence of acute right-sided rib fracture or acute thoracic spine injury. 2. No active cardiopulmonary process. Electronically Signed   By: Richardean Sale M.D.   On: 01/22/2022 12:12    Procedures Procedures    Medications Ordered in ED Medications - No data to display  ED Course/ Medical Decision Making/ A&P                           Medical Decision Making Amount and/or Complexity of Data Reviewed Radiology: ordered.  Risk Prescription drug management.   Patient is a 41 year old male with history of GERD, depression, anxiety, and OSA who presents the emergency department complaining of ongoing back pain after a fall 3 days ago.  States that he fell and hit his back on car door.    Chart reviewed. Was seen in the ER on 7/30 for similar symptoms, was discharged with muscle relaxer.  Patient states pain has been ongoing despite muscle relaxer and ibuprofen.  On exam patient is standing in the exam room in no acute distress.  No midline spinal tenderness, step-offs or crepitus.  Lumbar muscle tenderness to palpation on the right, with muscle cramping.  No numbness,  tingling, saddle paresthesias.  No complaints of urinary retention, urine or bowel incontinence.  Low suspicion for myelopathy.  X-rays performed of the lower back and the right ribs show no acute fractures or dislocations. I reviewed patient's imaging and agree with the radiologist interpretation.   Explained the results to the patient.  I think he strained his right lumbar region.  Will attempt a short course of pain medication, and a course of steroids.  Encouraged topical patches or creams.  Also given stretches.  We discussed reasons return to the emergency department, and patient's agreeable to the plan.        Final Clinical Impression(s) / ED Diagnoses Final diagnoses:  Fall, subsequent encounter  Acute right-sided low back pain without sciatica  Strain of lumbar region, subsequent encounter    Rx / DC Orders ED Discharge Orders          Ordered  HYDROcodone-acetaminophen (NORCO/VICODIN) 5-325 MG tablet  Every 4 hours PRN        01/22/22 1245    methylPREDNISolone (MEDROL DOSEPAK) 4 MG TBPK tablet        01/22/22 1245           Portions of this report may have been transcribed using voice recognition software. Every effort was made to ensure accuracy; however, inadvertent computerized transcription errors may be present.    Jeanella Flattery 01/22/22 1305    Maia Plan, MD 01/27/22 1650

## 2024-01-29 ENCOUNTER — Other Ambulatory Visit: Payer: Self-pay

## 2024-01-29 ENCOUNTER — Emergency Department (HOSPITAL_COMMUNITY)
Admission: EM | Admit: 2024-01-29 | Discharge: 2024-01-29 | Disposition: A | Discharge status: Home / Self Care | Payer: Self-pay | Attending: Emergency Medicine | Admitting: Emergency Medicine

## 2024-01-29 ENCOUNTER — Encounter (HOSPITAL_COMMUNITY): Payer: Self-pay

## 2024-01-29 DIAGNOSIS — F17200 Nicotine dependence, unspecified, uncomplicated: Secondary | ICD-10-CM | POA: Insufficient documentation

## 2024-01-29 DIAGNOSIS — S0990XA Unspecified injury of head, initial encounter: Secondary | ICD-10-CM

## 2024-01-29 DIAGNOSIS — W503XXA Accidental bite by another person, initial encounter: Secondary | ICD-10-CM

## 2024-01-29 DIAGNOSIS — Z23 Encounter for immunization: Secondary | ICD-10-CM | POA: Insufficient documentation

## 2024-01-29 DIAGNOSIS — Y92007 Garden or yard of unspecified non-institutional (private) residence as the place of occurrence of the external cause: Secondary | ICD-10-CM | POA: Insufficient documentation

## 2024-01-29 DIAGNOSIS — S0083XA Contusion of other part of head, initial encounter: Secondary | ICD-10-CM | POA: Insufficient documentation

## 2024-01-29 DIAGNOSIS — Y9389 Activity, other specified: Secondary | ICD-10-CM | POA: Insufficient documentation

## 2024-01-29 MED ORDER — AMOXICILLIN-POT CLAVULANATE 875-125 MG PO TABS
1.0000 | ORAL_TABLET | Freq: Once | ORAL | Status: AC
  Administered 2024-01-29: 1 via ORAL
  Filled 2024-01-29: qty 1

## 2024-01-29 MED ORDER — TETANUS-DIPHTH-ACELL PERTUSSIS 5-2.5-18.5 LF-MCG/0.5 IM SUSY
0.5000 mL | PREFILLED_SYRINGE | Freq: Once | INTRAMUSCULAR | Status: AC
  Administered 2024-01-29: 0.5 mL via INTRAMUSCULAR
  Filled 2024-01-29: qty 0.5

## 2024-01-29 MED ORDER — AMOXICILLIN-POT CLAVULANATE 875-125 MG PO TABS: 1.0000 | ORAL_TABLET | Freq: Two times a day (BID) | ORAL | 0 refills | Status: AC

## 2024-01-29 NOTE — Discharge Instructions (Addendum)
 It was a pleasure to take care of you today.  Based on your history, and physical exam I feel you are safe for discharge.  Today your tetanus was updated and you were given 1 dose of an oral antibiotic called Augmentin .  This antibiotic has also been prescribed to your outpatient pharmacy.  Please take as prescribed for the next 7 days.  Please continue to monitor your symptoms and if you develop symptoms of infection including fever, chills, redness surrounding the cut, drainage out of the cut, warmth, or extensive swelling please return the emergency department.  Today through shared decision making we decided not to image her head, if you experiencing the following symptoms including but not limited to visual disturbances, severe headache, nausea, vomiting, severe neck pain, severe head pain, dizziness, or other concerning symptom please return the emergency department or seek further medical care.  Otherwise recommend follow-up with your primary care and specialist as scheduled.

## 2024-01-29 NOTE — ED Notes (Signed)
 Cleaned wound with NS, wound covered with a gauze dressing
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
